# Patient Record
Sex: Male | Born: 1940 | Race: White | Hispanic: No | Marital: Married | Smoking: Never smoker
Health system: Southern US, Community
[De-identification: ages and names within clinical notes are randomized; demographics above are authoritative.]

## PROBLEM LIST (undated history)

## (undated) DIAGNOSIS — T7840XA Allergy, unspecified, initial encounter: Secondary | ICD-10-CM

## (undated) DIAGNOSIS — I10 Essential (primary) hypertension: Secondary | ICD-10-CM

## (undated) DIAGNOSIS — E785 Hyperlipidemia, unspecified: Secondary | ICD-10-CM

## (undated) DIAGNOSIS — E7800 Pure hypercholesterolemia, unspecified: Secondary | ICD-10-CM

## (undated) DIAGNOSIS — I6529 Occlusion and stenosis of unspecified carotid artery: Secondary | ICD-10-CM

## (undated) HISTORY — PX: MOUTH SURGERY: SHX715

## (undated) HISTORY — DX: Essential (primary) hypertension: I10

## (undated) HISTORY — PX: COLONOSCOPY: SHX174

## (undated) HISTORY — DX: Hyperlipidemia, unspecified: E78.5

## (undated) HISTORY — DX: Allergy, unspecified, initial encounter: T78.40XA

## (undated) HISTORY — DX: Occlusion and stenosis of unspecified carotid artery: I65.29

---

## 1965-07-23 HISTORY — PX: FINGER AMPUTATION: SHX636

## 1984-07-23 HISTORY — PX: INGUINAL HERNIA REPAIR: SUR1180

## 2002-07-08 ENCOUNTER — Ambulatory Visit (HOSPITAL_COMMUNITY): Admission: RE | Admit: 2002-07-08 | Discharge: 2002-07-08 | Payer: Self-pay | Admitting: *Deleted

## 2004-08-28 ENCOUNTER — Encounter: Admission: RE | Admit: 2004-08-28 | Discharge: 2004-08-28 | Payer: Self-pay | Admitting: Family Medicine

## 2006-01-04 ENCOUNTER — Encounter: Admission: RE | Admit: 2006-01-04 | Discharge: 2006-01-04 | Payer: Self-pay | Admitting: Family Medicine

## 2007-04-18 ENCOUNTER — Encounter: Admission: RE | Admit: 2007-04-18 | Discharge: 2007-04-18 | Payer: Self-pay | Admitting: Family Medicine

## 2007-05-09 ENCOUNTER — Ambulatory Visit: Payer: Self-pay | Admitting: Vascular Surgery

## 2007-11-07 ENCOUNTER — Ambulatory Visit: Payer: Self-pay | Admitting: Vascular Surgery

## 2008-10-29 ENCOUNTER — Ambulatory Visit: Payer: Self-pay | Admitting: Vascular Surgery

## 2009-10-28 ENCOUNTER — Ambulatory Visit: Payer: Self-pay | Admitting: Vascular Surgery

## 2010-08-13 ENCOUNTER — Encounter: Payer: Self-pay | Admitting: Family Medicine

## 2010-11-14 ENCOUNTER — Other Ambulatory Visit (INDEPENDENT_AMBULATORY_CARE_PROVIDER_SITE_OTHER): Payer: Medicare Other

## 2010-11-14 DIAGNOSIS — I6529 Occlusion and stenosis of unspecified carotid artery: Secondary | ICD-10-CM

## 2010-11-22 NOTE — Procedures (Unsigned)
CAROTID DUPLEX EXAM  INDICATION:  Follow up carotid disease.  HISTORY: Diabetes:  No. Cardiac:  No. Hypertension:  Yes. Smoking:  Tobacco use. Previous Surgery:  No. CV History:  No. Amaurosis Fugax No, Paresthesias No, Hemiparesis No.                                      RIGHT             LEFT Brachial systolic pressure:         132               138 Brachial Doppler waveforms:         WNL               WNL Vertebral direction of flow:        Antegrade         Antegrade DUPLEX VELOCITIES (cm/sec) CCA peak systolic                   109               152 ECA peak systolic                   279               131 ICA peak systolic                   101               76 ICA end diastolic                   29                16 PLAQUE MORPHOLOGY:                  Calcific          Calcific PLAQUE AMOUNT:                      Mild-to-moderate  Mild-to-moderate PLAQUE LOCATION:                    CCA/ECA/ICA       CCA/ICA  IMPRESSION: 1. 1% to 39% bilateral internal carotid artery stenosis.  This     represents a downgrade in severity bilaterally; however, velocities     are stable compared to prior reports.  Plaque is calcific with     acoustic shadowing present. 2. Bilateral vertebral arteries are within normal limits.  ___________________________________________ Larina Earthly, M.D.  LT/MEDQ  D:  11/14/2010  T:  11/14/2010  Job:  [id number]

## 2010-12-05 NOTE — Consult Note (Signed)
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  He has a known history of prior  asymptomatic carotid disease and has had several serial followup studies  regarding this.  He specifically denies any prior amaurosis fugax,  transient ischemic attack, or stroke.   PAST MEDICAL HISTORY:  Significant for elevated blood pressure and  elevated cholesterol.   FAMILY HISTORY:  Negative for premature atherosclerotic disease.   SOCIAL HISTORY:  He is married with 2 children.  He is retired.  He does  not smoke cigarettes, does chew tobacco, and does not drink alcohol on a  regular basis.   REVIEW OF SYSTEMS:  Specifically negative for cardiac, pulmonary, GI, or  GU symptoms.  He does report pain in his legs with walking, has no  neurologic deficits.   MEDICATIONS:  1. Simvastatin.  2. Trandolapril.  3. Daily aspirin.   PHYSICAL EXAM:  Well-developed, well-nourished, white male appearing  stated age of 65.  Blood pressure is 144/78.  Pulse 68.  Respirations  18.  He is grossly intact neurologically.  His carotid arteries do  reveal soft bruits bilaterally.  He has 2+ radial pulses and 2+ femoral  pulses.   I have his most recent carotid duplex from [facility].  This  reveals approximately 70% stenosis in the right internal carotid artery  and no significant left carotid stenosis.  This has shown some  progression over the past several years.  I had a long discussion with  the patient and his wife present.  I have recommended that he undergo  serial duplex followup to rule out any progression of his stenosis.  I  explained that he was at the threshold for elective repair for  asymptomatic disease and if he had any progression we would recommend  endarterectomy.  Also, discussed  symptoms of carotid disease with him  and he knows to notify us immediately should this occur.  Otherwise, we  will see him in 6 months with repeat carotid duplex at that time.   Larina Earthly, M.D.  Electronically Signed   TFE/MEDQ  D:  05/09/2007  T:  05/13/2007  Job:  [id number]   cc:   Ernestina Penna, M.D.

## 2010-12-05 NOTE — Procedures (Signed)
CAROTID DUPLEX EXAM   INDICATION:  Followup evaluation of known carotid artery disease.   HISTORY:  Diabetes:  No.  Cardiac:  No.  Hypertension:  Yes.  Smoking:  Patient chews tobacco.  Previous Surgery:  No.  CV History:  Patient reports no cerebrovascular symptoms at this time.  Previous duplex on April 18, 2007, revealed a 50-69% right ICA  stenosis and a less than 50% left ICA stenosis.  Amaurosis Fugax No, Paresthesias No, Hemiparesis No                                       RIGHT             LEFT  Brachial systolic pressure:         148               152  Brachial Doppler waveforms:         Triphasic         Triphasic  Vertebral direction of flow:        Antegrade         Antegrade  DUPLEX VELOCITIES (cm/sec)  CCA peak systolic                   133               103  ECA peak systolic                   373               108  ICA peak systolic                   98                84  ICA end diastolic                   31                26  PLAQUE MORPHOLOGY:                  Calcified         Calcified  PLAQUE AMOUNT:                      Mild              Mild  PLAQUE LOCATION:                    Proximal ICA, ECA Proximal ICA   IMPRESSION:  1. ICA stenosis 20-39% bilaterally.  2. Right ECA stenosis.   ___________________________________________  Larina Earthly, M.D.   MC/MEDQ  D:  11/07/2007  T:  11/07/2007  Job:  [id number]

## 2010-12-05 NOTE — Assessment & Plan Note (Signed)
OFFICE VISIT   Albert Garner, Albert Garner  DOB:  05/28/1941                                       11/07/2007  JXBJY#:[id number]   The patient presents today for continued followup of his asymptomatic  carotid disease.  I had seen him 6 months ago, at which time he had had  an ultrasound from [facility] of his carotids revealing moderate  to severe right internal carotid artery stenosis.  He does remain  completely asymptomatic.  He has had no cardiac disease.  He has no  neurologic deficits.   PAST MEDICAL HISTORY:  Significant for hypertension, elevated blood  pressure.  He does not smoke.  He does chew tobacco and does not drink  alcohol.   MEDICATIONS:  Simvastatin, perindopril, and daily aspirin.   PHYSICAL EXAM:  Well-developed, well-nourished white male appearing  stated age of 42.  Blood pressure is 143/68, pulse 70, respirations 16.  He has 2+ radial pulses bilaterally.  He is grossly intact  neurologically.  He does have a harsh right carotid bruit and no bruit  on the left.   He underwent repeat carotid duplex today.  This reveals significant  difference in the study done at [facility].  It appears that he  does have external carotid stenosis, but we cannot identify any evidence  of internal carotid artery stenosis bilaterally.  I discussed this at  length with the patient and explained that there is no clinical  significance of external carotid stenosis.  I explained that we would  like to see him again 1 more time in 1 year to repeat this, to confirm  this study since it is so significantly different from a study at an  outlying vascular lab.  He will notify us should he develop any  neurologic deficits.  Otherwise, we will see him again in 1 year for  repeat carotid duplex.   Larina Earthly, M.D.  Electronically Signed   TFE/MEDQ  D:  11/07/2007  T:  11/10/2007  Job:  [id number]   cc:   Ernestina Penna, M.D.

## 2010-12-05 NOTE — Procedures (Signed)
CAROTID DUPLEX EXAM   INDICATION:  Followup known carotid disease.   HISTORY:  Diabetes:  No.  Cardiac:  No.  Hypertension:  Yes.  Smoking:  Chews.  Previous Surgery:  No.  CV History:  No.  Amaurosis Fugax No, Paresthesias No, Hemiparesis No                                       RIGHT             LEFT  Brachial systolic pressure:  Brachial Doppler waveforms:  Vertebral direction of flow:  DUPLEX VELOCITIES (cm/sec)  CCA peak systolic                   119               146  ECA peak systolic                   369               132  ICA peak systolic                   103               97  ICA end diastolic                   35                27  PLAQUE MORPHOLOGY:                  Heterogeneous     Heterogeneous  PLAQUE AMOUNT:                      Mild              Mild  PLAQUE LOCATION:                    Bifurcation, ICA, ECA               Bifurcation, ICA, ECA   IMPRESSION:  1. There is 40%-59% stenosis noted in the bilateral internal carotid      arteries.  2. Right external carotid artery stenosis.  3. Antegrade bilateral vertebral arteries.   ___________________________________________  Larina Earthly, M.D.   NT/MEDQ  D:  10/28/2009  T:  10/28/2009  Job:  [id number]

## 2010-12-05 NOTE — Procedures (Signed)
CAROTID DUPLEX EXAM   INDICATION:  Follow up known carotid artery disease.   HISTORY:  Diabetes:  No.  Cardiac:  No.  Hypertension:  Yes.  Smoking:  Chews tobacco.  Previous Surgery:  CV History:  Amaurosis Fugax No, Paresthesias No, Hemiparesis No.                                       RIGHT             LEFT  Brachial systolic pressure:         142               130  Brachial Doppler waveforms:         Biphasic          Biphasic  Vertebral direction of flow:        Antegrade         Antegrade  DUPLEX VELOCITIES (cm/sec)  CCA peak systolic                   100               108  ECA peak systolic                   407               177  ICA peak systolic                   124               120  ICA end diastolic                   38                31  PLAQUE MORPHOLOGY:                  Heterogenous      Heterogenous  PLAQUE AMOUNT:                      Mild              Mild  PLAQUE LOCATION:                    ICA, ECA          ICA, ECA   IMPRESSION:  1. 40-59% stenosis noted in the bilateral internal carotid arteries.  2. Antegrade bilateral vertebral arteries.       ___________________________________________  Larina Earthly, M.D.   MG/MEDQ  D:  10/29/2008  T:  10/29/2008  Job:  [id number]

## 2012-02-06 ENCOUNTER — Other Ambulatory Visit: Payer: Self-pay

## 2012-02-06 DIAGNOSIS — I6529 Occlusion and stenosis of unspecified carotid artery: Secondary | ICD-10-CM

## 2012-02-07 ENCOUNTER — Telehealth: Payer: Self-pay | Admitting: Vascular Surgery

## 2012-02-07 NOTE — Telephone Encounter (Addendum)
Message copied by Shari Prows on Thu Feb 07, 2012 10:09 AM ------      Message from: Phillips Odor      Created: Wed Feb 06, 2012  3:55 PM      Regarding: needs appt and vascular lab       Rec'd call from Dr. Scharlene Gloss office / [facility].  Pt. Needs to have f/u carotid duplex and office visit.  Last Carotid U/S was 10/2010.  Please call pt with appts.  I will place order in computer.  Per above note I scheduled an appointment for a carotid u/s and fu w/ tfe on 03/18/12 at 11am carotid u/s and 12:15pm w/ tfe. Left message for pt MV:HQIO and alos mailed letter. Drinda Butts tobin

## 2012-03-17 ENCOUNTER — Encounter: Payer: Self-pay | Admitting: Vascular Surgery

## 2012-03-18 ENCOUNTER — Other Ambulatory Visit (INDEPENDENT_AMBULATORY_CARE_PROVIDER_SITE_OTHER): Payer: Medicare Other

## 2012-03-18 ENCOUNTER — Encounter: Payer: Self-pay | Admitting: Vascular Surgery

## 2012-03-18 ENCOUNTER — Ambulatory Visit (INDEPENDENT_AMBULATORY_CARE_PROVIDER_SITE_OTHER): Payer: Medicare Other | Admitting: Vascular Surgery

## 2012-03-18 VITALS — BP 142/74 | HR 63 | Resp 18 | Ht 73.0 in | Wt 169.1 lb

## 2012-03-18 DIAGNOSIS — I6529 Occlusion and stenosis of unspecified carotid artery: Secondary | ICD-10-CM

## 2012-03-18 NOTE — Progress Notes (Signed)
Vascular and Vein Specialist of [facility]   Patient name: Albert Garner MRN: [id number] DOB: Dec 23, 1940 Sex: male   Referred by: Margo Aye  Reason for referral:  Chief Complaint  Patient presents with  . Follow-up    carotid duplex 03-18-2012  . Carotid    HISTORY OF PRESENT ILLNESS: Patient is a 71 year old gentleman with known moderate right carotid artery stenosis. He undergoes yearly carotid duplex for further evaluation rule out progression of this. He specifically denies any prior amaurosis fugax, transient ischemic attack or stroke. He has no history of coronary disease. He is quite active with no major medical difficulties.  Past Medical History  Diagnosis Date  . Carotid artery occlusion   . Hypertension     Past Surgical History  Procedure Date  . Inguinal hernia repair 1986  . 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 Marital Status: Married    Spouse Name: N/A    Number of Children: N/A  . Years of Education: N/A   Occupational History  . Not on file.   Social History Main Topics  . Smoking status: Never Smoker   . Smokeless tobacco: Current User    Types: Chew  . Alcohol Use: No  . Drug Use: No  . Sexually Active: Not on file   Other Topics Concern  . Not on file   Social History Narrative  . No narrative on file    History reviewed. No pertinent family history.  Allergies as of 03/18/2012  . (No Known Allergies)    Current Outpatient Prescriptions on File Prior to Visit  Medication Sig Dispense Refill  . aspirin 81 MG tablet Take 81 mg by mouth daily.      . simvastatin (ZOCOR) 40 MG tablet Take 40 mg by mouth every evening.      . trandolapril (MAVIK) 1 MG tablet Take 1 mg by mouth daily.         REVIEW OF SYSTEMS:  Positives indicated with an "X"  CARDIOVASCULAR:  [ ]  chest pain   [ ]  chest pressure   [ ]  palpitations   [ ]  orthopnea   [ ]  dyspnea on exertion   [ ]  claudication   [ ]  rest pain    [ ]  DVT   [ ]  phlebitis PULMONARY:   [ ]  productive cough   [ ]  asthma   [ ]  wheezing NEUROLOGIC:   [ ]  weakness  [ ]  paresthesias  [ ]  aphasia  [ ]  amaurosis  [ ]  dizziness HEMATOLOGIC:   [ ]  bleeding problems   [ ]  clotting disorders MUSCULOSKELETAL:  [ ]  joint pain   [ ]  joint swelling GASTROINTESTINAL: [ ]   blood in stool  [ ]   hematemesis GENITOURINARY:  [ ]   dysuria  [ ]   hematuria PSYCHIATRIC:  [ ]  history of major depression INTEGUMENTARY:  [ ]  rashes  [ ]  ulcers CONSTITUTIONAL:  [ ]  fever   [ ]  chills  PHYSICAL EXAMINATION:  General: The patient is a well-nourished male, in no acute distress. Vital signs are BP 142/74  Pulse 63  Resp 18  Ht 6\' 1"  (1.854 m)  Wt 169 lb 1.6 oz (76.703 kg)  BMI 22.31 kg/m2 Pulmonary: There is a good air exchange bilaterally without wheezing or rales. Abdomen: Soft and non-tender with normal pitch bowel sounds. Musculoskeletal: There are no major deformities.  There is no significant extremity pain. Neurologic: No focal  weakness or paresthesias are detected, Skin: There are no ulcer or rashes noted. Psychiatric: The patient has normal affect. Cardiovascular: There is a regular rate and rhythm without significant murmur appreciated. Carotid: Right carotid bruit, no bruising the left Pulse status 2+ radial 2+ femoral 2+ popliteal pulses bilaterally  [facility] Vascular Lab Studies:  Ordered and Independently Reviewed moderate 40-59% right internal carotid artery stenosis and no significant left carotid stenosis. He does have a high-grade right external carotid stenosis  Impression and Plan:  Asymptomatic moderate right carotid stenosis. The patient is right-handed. I reviewed symptoms with her carotid disease with patient to notify us immediately should this occur. Otherwise he'll be seen on a yearly basis to rule out asymptomatic progression.    Ninoska Goswick [facility] [place] Office: [phone]

## 2012-03-18 NOTE — Addendum Note (Signed)
Addended by: Sharee Pimple on: 03/18/2012 01:58 PM   Modules accepted: Orders

## 2013-03-19 ENCOUNTER — Ambulatory Visit: Payer: Self-pay | Admitting: Family

## 2013-03-19 ENCOUNTER — Other Ambulatory Visit: Payer: Medicare Other

## 2013-04-03 ENCOUNTER — Encounter: Payer: Self-pay | Admitting: Family

## 2013-04-06 ENCOUNTER — Ambulatory Visit (INDEPENDENT_AMBULATORY_CARE_PROVIDER_SITE_OTHER): Payer: Medicare Other | Admitting: Family

## 2013-04-06 ENCOUNTER — Encounter: Payer: Self-pay | Admitting: Family

## 2013-04-06 ENCOUNTER — Other Ambulatory Visit: Payer: Self-pay

## 2013-04-06 ENCOUNTER — Other Ambulatory Visit (INDEPENDENT_AMBULATORY_CARE_PROVIDER_SITE_OTHER): Payer: Medicare Other | Admitting: *Deleted

## 2013-04-06 ENCOUNTER — Ambulatory Visit: Payer: Self-pay | Admitting: Family

## 2013-04-06 DIAGNOSIS — I6529 Occlusion and stenosis of unspecified carotid artery: Secondary | ICD-10-CM

## 2013-04-06 NOTE — Progress Notes (Signed)
Established Carotid Patient  History of Present Illness  Albert Garner is a 72 y.o. male patient who has been followed by Dr. Arbie Cookey for known carotid stenosis.  Patient has not had previous  CEA or stent placement.  Patient has Negative history of TIA or stroke symptom.  The patient denies amaurosis fugax or monocular blindness.  The patient  denies facial drooping.  Pt. denies hemiplegia.  The patient denies receptive or expressive aphasia.  Pt. denies extremity weakness.  The patient's previous neurologic deficits are Unchanged. He walks a great deal.   denies New Medical or Surgical History  Pt Diabetic: No Pt smoker: non-smoker  Pt meds include: Statin : Yes Betablocker: No ASA: Yes Other anticoagulants/antiplatelets: none   Past Medical History  Diagnosis Date  . Carotid artery occlusion   . Hypertension     Social History History  Substance Use Topics  . Smoking status: Never Smoker   . Smokeless tobacco: Current User    Types: Chew  . Alcohol Use: No    Family History Family History  Problem Relation Age of Onset  . Family history unknown: Yes    Surgical History Past Surgical History  Procedure Laterality Date  . Inguinal hernia repair  1986  . Finger amputation  1967    left hand 3 rd digit    No Known Allergies  Current Outpatient Prescriptions  Medication Sig Dispense Refill  . aspirin 81 MG tablet Take 81 mg by mouth daily.      . simvastatin (ZOCOR) 40 MG tablet Take 40 mg by mouth every evening.      . trandolapril (MAVIK) 1 MG tablet Take 1 mg by mouth daily.       No current facility-administered medications for this visit.    Review of Systems : [x]  Positive   [ ]  Denies  General:[ ]  Weight loss,  [ ]  Weight gain, [ ]  Loss of appetite, [ ]  Fever, [ ]  chills  Neurologic: [ ]  Dizziness, [ ]  Blackouts, [ ]  Headaches, [ ]  Seizure [ ]  Stroke, [ ]  "Mini stroke", [ ]  Slurred speech, [ ]  Temporary blindness;  [  ]weakness,  Ear/Nose/Throat: [ ]  Change in hearing, [ ]  Nose bleeds, [ ]  Hoarseness  Vascular:[ ]  Pain in legs with walking, [ ]  Pain in feet while lying flat , [ ]   Non-healing ulcer, [ ]  Blood clot in vein,    Pulmonary: [ ]  Home oxygen, [ ]   Productive cough, [ ]  Bronchitis, [ ]  Coughing up blood,  [ ]  Asthma, [ ]  Wheezing  Musculoskeletal:  [ ]  Arthritis, [ ]  Joint pain, [ ]  low back pain  Cardiac: [ ]  Chest pain, [ ]  Shortness of breath when lying flat, [ ]  Shortness of breath with exertion, [ ]  Palpitations, [ ]  Heart murmur, [ ]   Atrial fibrillation  Hematologic:[ ]  Easy Bruising, [ ]  Anemia; [ ]  Hepatitis  Psychiatric: [ ]   Depression, [ ]  Anxiety   Gastrointestinal: [ ]  Black stool, [ ]  Blood in stool, [ ]  Peptic ulcer disease,  [ ]  Gastroesophageal Reflux, [ ]  Trouble swallowing, [ ]  Diarrhea, [ ]  Constipation  Urinary: [ ]  chronic Kidney disease, [ ]  on HD, [ ]  Burning with urination, [ ]  Frequent urination, [ ]  Difficulty urinating;   Skin: [ ]  Rashes, [ ]  Wounds    Physical Examination  Filed Vitals:   04/06/13 1511  BP: 134/72  Pulse: 61  Resp:    Filed Weights  04/06/13 1507  Weight: 162 lb (73.483 kg)   Body mass index is 21.38 kg/(m^2).  General: WDWN male in NAD GAIT: normal Eyes: PERRLA Pulmonary:  CTAB, Negative  Rales, Negative rhonchi, & Negative wheezing.  Cardiac: regular Rhythm ,  Negative Murmurs.  VASCULAR EXAM Carotid Bruits Left Right   Negative Positive                                                                                                                                LE Pulses LEFT RIGHT       FEMORAL   palpable   palpable        POPLITEAL  not palpable   not palpable       POSTERIOR TIBIAL  palpable    palpable        DORSALIS PEDIS      ANTERIOR TIBIAL  palpable   palpable       Gastrointestinal: soft, nontender, BS WNL, no r/g,  negative masses.  Musculoskeletal: Negative muscle atrophy/wasting. M/S 5/5  throughout, Extremities without ischemic changes.  Neurologic: A&O X 3; Appropriate Affect ; SENSATION ;normal;  Speech is normal CN 2-12 intact, Pain and light touch intact in extremities, Motor exam as listed above.   Non-Invasive Vascular Imaging CAROTID DUPLEX 04/06/2013   Right ICA less than 40% stenosis Left ICA less than 40% stenosis  These findings are Improved from previous exam  Assessment: Albert Garner is a 72 y.o. male who presents with:Asymptomatic<40% Bilateral ICA  stenosis The  ICA stenosis is  Improved from previous exam. The right carotid bruit is secondary to right external carotid stenosis, which has no impact on stroke risk.  Plan: Follow-up in 1 year with Carotid Duplex scan.   I discussed in depth with the patient the nature of atherosclerosis, and emphasized the importance of maximal medical management including strict control of blood pressure, blood glucose, and lipid levels, obtaining regular exercise, and continued cessation of smoking.  The patient is aware that without maximal medical management the underlying atherosclerotic disease process will progress, limiting the benefit of any interventions. The patient was given information about stroke prevention and what symptoms should prompt the patient to seek immediate medical care. Thank you for allowing [place] to participate in this patient's care.  Charisse March, RN, MSN, FNP-C [facility] [place] Office: [phone]  Clinic Physician: Myra Gianotti  04/06/2013 2:19 PM

## 2013-04-06 NOTE — Patient Instructions (Addendum)
Stroke Prevention Some medical conditions and behaviors are associated with an increased chance of having a stroke. You may prevent a stroke by making healthy choices and managing medical conditions. Reduce your risk of having a stroke by:  Staying physically active. Get at least 30 minutes of activity on most or all days.  Not smoking. It may also be helpful to avoid exposure to secondhand smoke.  Limiting alcohol use. Moderate alcohol use is considered to be:  No more than 2 drinks per day for men.  No more than 1 drink per day for nonpregnant women.  Eating healthy foods.  Include 5 or more servings of fruits and vegetables a day.  Certain diets may be prescribed to address high blood pressure, high cholesterol, diabetes, or obesity.  Managing your cholesterol levels.  A low-saturated fat, low-trans fat, low-cholesterol, and high-fiber diet may control cholesterol levels.  Take any prescribed medicines to control cholesterol as directed by your caregiver.  Managing your diabetes.  A controlled-carbohydrate, controlled-sugar diet is recommended to manage diabetes.  Take any prescribed medicines to control diabetes as directed by your caregiver.  Controlling your high blood pressure (hypertension).  A low-salt (sodium), low-saturated fat, low-trans fat, and low-cholesterol diet is recommended to manage high blood pressure.  Take any prescribed medicines to control hypertension as directed by your caregiver.  Maintaining a healthy weight.  A reduced-calorie, low-sodium, low-saturated fat, low-trans fat, low-cholesterol diet is recommended to manage weight.  Stopping drug abuse.  Avoiding birth control pills.  Talk to your caregiver about the risks of taking birth control pills if you are over 35 years old, smoke, get migraines, or have ever had a blood clot.  Getting evaluated for sleep disorders (sleep apnea).  Talk to your caregiver about getting a sleep evaluation  if you snore a lot or have excessive sleepiness.  Taking medicines as directed by your caregiver.  For some people, aspirin or blood thinners (anticoagulants) are helpful in reducing the risk of forming abnormal blood clots that can lead to stroke. If you have the irregular heart rhythm of atrial fibrillation, you should be on a blood thinner unless there is a good reason you cannot take them.  Understand all your medicine instructions. SEEK IMMEDIATE MEDICAL CARE IF:   You have sudden weakness or numbness of the face, arm, or leg, especially on one side of the body.  You have sudden confusion.  You have trouble speaking (aphasia) or understanding.  You have sudden trouble seeing in one or both eyes.  You have sudden trouble walking.  You have dizziness.  You have a loss of balance or coordination.  You have a sudden, severe headache with no known cause.  You have new chest pain or an irregular heartbeat. Any of these symptoms may represent a serious problem that is an emergency. Do not wait to see if the symptoms will go away. Get medical help right away. Call your local emergency services (911 in [place] Do not drive yourself to the hospital. Document Released: 08/16/2004 Document Revised: 10/01/2011 Document Reviewed: 02/26/2011 [employer] Patient Information 2014 [employer].  

## 2013-09-17 ENCOUNTER — Other Ambulatory Visit: Payer: Self-pay | Admitting: Family

## 2013-09-17 DIAGNOSIS — I6529 Occlusion and stenosis of unspecified carotid artery: Secondary | ICD-10-CM

## 2014-04-06 ENCOUNTER — Other Ambulatory Visit (HOSPITAL_COMMUNITY): Payer: Medicare Other

## 2014-04-06 ENCOUNTER — Ambulatory Visit: Payer: Medicare Other | Admitting: Family

## 2014-04-12 ENCOUNTER — Encounter: Payer: Self-pay | Admitting: Family

## 2014-04-13 ENCOUNTER — Ambulatory Visit (HOSPITAL_COMMUNITY)
Admission: RE | Admit: 2014-04-13 | Discharge: 2014-04-13 | Disposition: A | Discharge status: Home / Self Care | Payer: Medicare Other | Source: Ambulatory Visit | Attending: Family | Admitting: Family

## 2014-04-13 ENCOUNTER — Encounter (INDEPENDENT_AMBULATORY_CARE_PROVIDER_SITE_OTHER): Payer: Medicare Other | Admitting: Family

## 2014-04-13 DIAGNOSIS — I6529 Occlusion and stenosis of unspecified carotid artery: Secondary | ICD-10-CM | POA: Diagnosis present

## 2014-04-14 ENCOUNTER — Encounter: Payer: Self-pay | Admitting: Vascular Surgery

## 2014-04-14 NOTE — Patient Instructions (Signed)
Dear Mr. Passero,  Your recent Lab study on Sept. 22, 2015 indicates: NO significant change noted when compared to the previous exam on Sept. 15, 2014. Please follow up with [place] in ONE year.          Stroke Prevention Some medical conditions and behaviors are associated with an increased chance of having a stroke. You may prevent a stroke by making healthy choices and managing medical conditions. HOW CAN I REDUCE MY RISK OF HAVING A STROKE?   Stay physically active. Get at least 30 minutes of activity on most or all days.  Do not smoke. It may also be helpful to avoid exposure to secondhand smoke.  Limit alcohol use. Moderate alcohol use is considered to be:  No more than 2 drinks per day for men.  No more than 1 drink per day for nonpregnant women.  Eat healthy foods. This involves:  Eating 5 or more servings of fruits and vegetables a day.  Making dietary changes that address high blood pressure (hypertension), high cholesterol, diabetes, or obesity.  Manage your cholesterol levels.  Making food choices that are high in fiber and low in saturated fat, trans fat, and cholesterol may control cholesterol levels.  Take any prescribed medicines to control cholesterol as directed by your health care provider.  Manage your diabetes.  Controlling your carbohydrate and sugar intake is recommended to manage diabetes.  Take any prescribed medicines to control diabetes as directed by your health care provider.  Control your hypertension.  Making food choices that are low in salt (sodium), saturated fat, trans fat, and cholesterol is recommended to manage hypertension.  Take any prescribed medicines to control hypertension as directed by your health care provider.  Maintain a healthy weight.  Reducing calorie intake and making food choices that are low in sodium, saturated fat, trans fat, and cholesterol are recommended to manage weight.  Stop drug abuse.  Avoid taking  birth control pills.  Talk to your health care provider about the risks of taking birth control pills if you are over 57 years old, smoke, get migraines, or have ever had a blood clot.  Get evaluated for sleep disorders (sleep apnea).  Talk to your health care provider about getting a sleep evaluation if you snore a lot or have excessive sleepiness.  Take medicines only as directed by your health care provider.  For some people, aspirin or blood thinners (anticoagulants) are helpful in reducing the risk of forming abnormal blood clots that can lead to stroke. If you have the irregular heart rhythm of atrial fibrillation, you should be on a blood thinner unless there is a good reason you cannot take them.  Understand all your medicine instructions.  Make sure that other conditions (such as anemia or atherosclerosis) are addressed. SEEK IMMEDIATE MEDICAL CARE IF:   You have sudden weakness or numbness of the face, arm, or leg, especially on one side of the body.  Your face or eyelid droops to one side.  You have sudden confusion.  You have trouble speaking (aphasia) or understanding.  You have sudden trouble seeing in one or both eyes.  You have sudden trouble walking.  You have dizziness.  You have a loss of balance or coordination.  You have a sudden, severe headache with no known cause.  You have new chest pain or an irregular heartbeat. Any of these symptoms may represent a serious problem that is an emergency. Do not wait to see if the symptoms will go away.  Get medical help at once. Call your local emergency services (911 in [place] Do not drive yourself to the hospital. Document Released: 08/16/2004 Document Revised: 11/23/2013 Document Reviewed: 01/09/2013 [facility] Patient Information 2015 [place], [place]. This information is not intended to replace advice given to you by your health care provider. Make sure you discuss any questions you have with your health care  provider.

## 2014-04-15 NOTE — Progress Notes (Signed)
Lab only 

## 2014-11-24 ENCOUNTER — Encounter: Payer: Self-pay | Admitting: Family Medicine

## 2014-12-13 ENCOUNTER — Encounter: Payer: Self-pay | Admitting: Internal Medicine

## 2014-12-13 ENCOUNTER — Encounter: Payer: Self-pay | Admitting: Family Medicine

## 2014-12-13 ENCOUNTER — Ambulatory Visit (INDEPENDENT_AMBULATORY_CARE_PROVIDER_SITE_OTHER): Payer: Medicare Other | Admitting: Family Medicine

## 2014-12-13 VITALS — BP 138/72 | HR 60 | Temp 97.5°F | Resp 18 | Ht <= 58 in | Wt 156.0 lb

## 2014-12-13 DIAGNOSIS — I2510 Atherosclerotic heart disease of native coronary artery without angina pectoris: Secondary | ICD-10-CM | POA: Diagnosis not present

## 2014-12-13 DIAGNOSIS — D485 Neoplasm of uncertain behavior of skin: Secondary | ICD-10-CM

## 2014-12-13 DIAGNOSIS — Z125 Encounter for screening for malignant neoplasm of prostate: Secondary | ICD-10-CM

## 2014-12-13 DIAGNOSIS — Z0000 Encounter for general adult medical examination without abnormal findings: Secondary | ICD-10-CM | POA: Diagnosis not present

## 2014-12-13 LAB — COMPLETE METABOLIC PANEL WITH GFR
ALT: 13 U/L (ref 0–53)
AST: 17 U/L (ref 0–37)
Albumin: 4.1 g/dL (ref 3.5–5.2)
Alkaline Phosphatase: 58 U/L (ref 39–117)
BILIRUBIN TOTAL: 0.7 mg/dL (ref 0.2–1.2)
BUN: 14 mg/dL (ref 6–23)
CO2: 30 meq/L (ref 19–32)
CREATININE: 0.87 mg/dL (ref 0.50–1.35)
Calcium: 8.9 mg/dL (ref 8.4–10.5)
Chloride: 105 mEq/L (ref 96–112)
GFR, Est African American: >89 mL/min
GFR, Est Non African American: 86 mL/min
Glucose, Bld: 93 mg/dL (ref 70–99)
POTASSIUM: 4.3 meq/L (ref 3.5–5.3)
SODIUM: 142 meq/L (ref 135–145)
TOTAL PROTEIN: 6.3 g/dL (ref 6.0–8.3)

## 2014-12-13 LAB — CBC WITH DIFFERENTIAL/PLATELET
Basophils Absolute: 0 10*3/uL (ref 0.0–0.1)
Basophils Relative: 0 % (ref 0–1)
Eosinophils Absolute: 0.1 10*3/uL (ref 0.0–0.7)
Eosinophils Relative: 3 % (ref 0–5)
HEMATOCRIT: 40 % (ref 39.0–52.0)
HEMOGLOBIN: 13.5 g/dL (ref 13.0–17.0)
LYMPHS ABS: 1.1 10*3/uL (ref 0.7–4.0)
LYMPHS PCT: 28 % (ref 12–46)
MCH: 30.7 pg (ref 26.0–34.0)
MCHC: 33.8 g/dL (ref 30.0–36.0)
MCV: 90.9 fL (ref 78.0–100.0)
MPV: 10 fL (ref 8.6–12.4)
Monocytes Absolute: 0.4 10*3/uL (ref 0.1–1.0)
Monocytes Relative: 9 % (ref 3–12)
NEUTROS PCT: 60 % (ref 43–77)
Neutro Abs: 2.5 10*3/uL (ref 1.7–7.7)
PLATELETS: 156 10*3/uL (ref 150–400)
RBC: 4.4 MIL/uL (ref 4.22–5.81)
RDW: 13.3 % (ref 11.5–15.5)
WBC: 4.1 10*3/uL (ref 4.0–10.5)

## 2014-12-13 LAB — LIPID PANEL
CHOL/HDL RATIO: 2.6 ratio
CHOLESTEROL: 132 mg/dL (ref 0–200)
HDL: 50 mg/dL (ref >=40)
LDL CALC: 68 mg/dL (ref 0–99)
TRIGLYCERIDES: 70 mg/dL (ref <150)
VLDL: 14 mg/dL (ref 0–40)

## 2014-12-13 NOTE — Progress Notes (Signed)
Subjective:    Patient ID: Albert Garner, male    DOB: 01/19/1941, 74 y.o.   MRN: [id number]  HPI Patient is a very pleasant 74 year old white male here today to establish care. Past medical history is significant for bilateral occlusion of the carotid arteries. Each is less than 40% and is being managed medically with statin therapy and an antiplatelet drug. He also has a history of hypertension and hyperlipidemia. He is overdue for colonoscopy. He is overdue for a prostate exam. He would like me to schedule the colonoscopy. We will perform a prostate exam today. The patient is also due for the pneumonia vaccine, a tetanus shot, and the shingles vaccine. We discussed all these vaccines at length. The patient elects to defer each of the shots at the present time. There are 2 lesions on his left temple just anterior to his ear. Each is approximately 6 mm in size. Each is erythematous with white scale. The patient states that his wife says each of these lesions are growing recently. 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 He elects to treat each of these lesions today with liquid nitrogen cryotherapy Past Medical History  Diagnosis Date  . Carotid artery occlusion   . Hypertension   . Hyperlipidemia    Past Surgical History  Procedure Laterality Date  . Inguinal hernia repair  1986  . 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 aspirin 81 MG tablet Take 81 mg by mouth daily.    . trandolapril (MAVIK) 1 MG tablet Take 1 mg by mouth daily.    . traZODone (DESYREL) 50 MG tablet Take 50 mg by mouth at bedtime.     No current facility-administered medications on file prior to visit.   No Known Allergies History   Social History  . Marital Status: Married    Spouse Name: N/A  . Number of Children: N/A  . Years of Education: N/A   Occupational History  .  Not on file.   Social History Main Topics  . Smoking status: Never Smoker   . Smokeless tobacco: Current User    Types: Chew  . Alcohol Use: No  . Drug Use: No  . Sexual Activity: Yes   Other Topics Concern  . Not on file   Social History Narrative   Family History  Problem Relation Age of Onset  . Heart disease Mother       Review of Systems  All other systems reviewed and are negative.      Objective:   Physical Exam  Constitutional: He is oriented to person, place, and time. He appears well-developed and well-nourished. No distress.  HENT:  Head: Normocephalic and atraumatic.  Right Ear: External ear normal.  Left Ear: External ear normal.  Nose: Nose normal.  Mouth/Throat: Oropharynx is clear and moist. No oropharyngeal exudate.  Eyes: Conjunctivae and EOM are normal. Pupils are equal, round, and reactive to light. Right eye exhibits no discharge. Left eye exhibits no discharge. No scleral icterus.  Neck: Normal range of motion. Neck supple. No JVD present. No tracheal deviation present. No thyromegaly present.  Cardiovascular: Normal rate, regular rhythm, normal heart sounds and intact distal pulses.  Exam reveals no gallop and no friction rub.   No murmur heard. Pulmonary/Chest: Effort normal and breath sounds normal. No stridor. No respiratory distress. He has no  wheezes. He has no rales. He exhibits no tenderness.  Abdominal: Soft. Bowel sounds are normal. He exhibits no distension and no mass. There is no tenderness. There is no rebound and no guarding.  Genitourinary: Rectum normal, prostate normal and penis normal.  Musculoskeletal: Normal range of motion. He exhibits no edema or tenderness.  Lymphadenopathy:    He has no cervical adenopathy.  Neurological: He is alert and oriented to person, place, and time. He has normal reflexes. He displays normal reflexes. No cranial nerve deficit. He exhibits normal muscle tone. Coordination normal.  Skin: Skin is warm.  No rash noted. He is not diaphoretic. There is erythema. No pallor.  Psychiatric: He has a normal mood and affect. His behavior is normal. Judgment and thought content normal.  Vitals reviewed.  please see the description in the history of present illness of the 2 lesions on his left temple just anterior to his ear.        Assessment & Plan:  ASCVD (arteriosclerotic cardiovascular disease) - Plan: CBC with Differential/Platelet, COMPLETE METABOLIC PANEL WITH GFR, Lipid panel  Prostate cancer screening - Plan: PSA, Medicare  Routine general medical examination at a health care facility  Neoplasm of uncertain behavior of skin  Both lesions on the left temple were treated with liquid nitrogen cryotherapy for a total of 30 seconds each. If the lesions persist, I would recommend a shave biopsy. The remainder of his physical exam is normal. I will schedule the patient for colonoscopy. His prostate exam today is normal. I will check a CBC, CMP, fasting lipid panel, and a PSA. Patient's goal LDL cholesterol is less than 70. I would like to change the patient off his simvastatin 80 mg a day due to the risk of myalgias and myositis. Depending upon the results of his fasting lipid panel I will either switch the patient to Lipitor 40 mg a day or decrease simvastatin to 40 mg a day. I recommended the pneumonia vaccine but the patient declines.

## 2014-12-14 ENCOUNTER — Other Ambulatory Visit: Payer: Self-pay | Admitting: Family Medicine

## 2014-12-14 LAB — PSA, MEDICARE: PSA: 0.64 ng/mL (ref <=4.00)

## 2014-12-14 MED ORDER — ATORVASTATIN CALCIUM 40 MG PO TABS: 40.0000 mg | ORAL_TABLET | Freq: Every day | ORAL | Status: DC

## 2014-12-17 ENCOUNTER — Telehealth: Payer: Self-pay | Admitting: Family Medicine

## 2014-12-17 MED ORDER — TRANDOLAPRIL 1 MG PO TABS: 1.0000 mg | ORAL_TABLET | Freq: Every day | ORAL | Status: DC

## 2014-12-17 NOTE — Telephone Encounter (Signed)
PT is needing a refill on trandolapril (MAVIK) 1 MG tablet (he has enough to last him to Sunday) [phone] [facility] [place], [place] - [address]

## 2014-12-17 NOTE — Telephone Encounter (Signed)
Medication called/sent to requested pharmacy  

## 2014-12-21 ENCOUNTER — Telehealth: Payer: Self-pay | Admitting: *Deleted

## 2014-12-21 MED ORDER — TRAZODONE HCL 50 MG PO TABS: 50.0000 mg | ORAL_TABLET | Freq: Every day | ORAL | Status: DC

## 2014-12-21 NOTE — Telephone Encounter (Signed)
ok 

## 2014-12-21 NOTE — Telephone Encounter (Signed)
?   OK to Refill  

## 2014-12-21 NOTE — Telephone Encounter (Signed)
The message I received Friday was for his BP med :  Lenore Manner at 12/17/2014 3:22 PM     Status: Signed       Expand All Collapse All   PT is needing a refill on trandolapril (MAVIK) 1 MG tablet (he has enough to last him to Sunday) [phone] [facility] [place], [place] - [address]       Trazadone sent to pharm

## 2014-12-21 NOTE — Telephone Encounter (Signed)
Refill on Trazadone 50mg , states called on Friday to have it sent to pharmacy and never received it.  [facility] [place], [place] - [address]

## 2015-01-12 ENCOUNTER — Ambulatory Visit (AMBULATORY_SURGERY_CENTER): Payer: Self-pay | Admitting: *Deleted

## 2015-01-12 VITALS — Ht 73.0 in | Wt 154.6 lb

## 2015-01-12 DIAGNOSIS — Z1211 Encounter for screening for malignant neoplasm of colon: Secondary | ICD-10-CM

## 2015-01-12 NOTE — Progress Notes (Signed)
No issues with past sedation No egg or soy allergy No diet pills no home 02

## 2015-01-21 HISTORY — PX: COLONOSCOPY: SHX174

## 2015-01-22 ENCOUNTER — Encounter: Payer: Self-pay | Admitting: Gastroenterology

## 2015-01-26 ENCOUNTER — Encounter: Payer: Self-pay | Admitting: Gastroenterology

## 2015-01-26 ENCOUNTER — Ambulatory Visit (AMBULATORY_SURGERY_CENTER): Payer: Medicare Other | Admitting: Gastroenterology

## 2015-01-26 VITALS — BP 127/55 | HR 60 | Temp 96.5°F | Resp 16 | Ht 73.0 in | Wt 154.0 lb

## 2015-01-26 DIAGNOSIS — D120 Benign neoplasm of cecum: Secondary | ICD-10-CM | POA: Diagnosis not present

## 2015-01-26 DIAGNOSIS — D122 Benign neoplasm of ascending colon: Secondary | ICD-10-CM

## 2015-01-26 DIAGNOSIS — Z1211 Encounter for screening for malignant neoplasm of colon: Secondary | ICD-10-CM | POA: Diagnosis not present

## 2015-01-26 DIAGNOSIS — D124 Benign neoplasm of descending colon: Secondary | ICD-10-CM

## 2015-01-26 DIAGNOSIS — K635 Polyp of colon: Secondary | ICD-10-CM

## 2015-01-26 MED ORDER — SODIUM CHLORIDE 0.9 % IV SOLN: 500.0000 mL | INTRAVENOUS | Status: DC

## 2015-01-26 NOTE — Patient Instructions (Signed)
YOU HAD AN ENDOSCOPIC PROCEDURE TODAY AT THE [facility]:   Refer to the procedure report that was given to you for any specific questions about what was found during the examination.  If the procedure report does not answer your questions, please call your gastroenterologist to clarify.  If you requested that your care partner not be given the details of your procedure findings, then the procedure report has been included in a sealed envelope for you to review at your convenience later.  YOU SHOULD EXPECT: Some feelings of bloating in the abdomen. Passage of more gas than usual.  Walking can help get rid of the air that was put into your GI tract during the procedure and reduce the bloating. If you had a lower endoscopy (such as a colonoscopy or flexible sigmoidoscopy) you may notice spotting of blood in your stool or on the toilet paper. If you underwent a bowel prep for your procedure, you may not have a normal bowel movement for a few days.  Please Note:  You might notice some irritation and congestion in your nose or some drainage.  This is from the oxygen used during your procedure.  There is no need for concern and it should clear up in a day or so.  SYMPTOMS TO REPORT IMMEDIATELY:   Following lower endoscopy (colonoscopy or flexible sigmoidoscopy):  Excessive amounts of blood in the stool  Significant tenderness or worsening of abdominal pains  Swelling of the abdomen that is new, acute  Fever of 100F or higher   For urgent or emergent issues, a gastroenterologist can be reached at any hour by calling [phone].   DIET: Your first meal following the procedure should be a small meal and then it is ok to progress to your normal diet. Heavy or fried foods are harder to digest and may make you feel nauseous or bloated.  Likewise, meals heavy in dairy and vegetables can increase bloating.  Drink plenty of fluids but you should avoid alcoholic beverages for 24  hours.  ACTIVITY:  You should plan to take it easy for the rest of today and you should NOT DRIVE or use heavy machinery until tomorrow (because of the sedation medicines used during the test).    FOLLOW UP: Our staff will call the number listed on your records the next business day following your procedure to check on you and address any questions or concerns that you may have regarding the information given to you following your procedure. If we do not reach you, we will leave a message.  However, if you are feeling well and you are not experiencing any problems, there is no need to return our call.  We will assume that you have returned to your regular daily activities without incident.  If any biopsies were taken you will be contacted by phone or by letter within the next 1-3 weeks.  Please call us at [phone] if you have not heard about the biopsies in 3 weeks.    SIGNATURES/CONFIDENTIALITY: You and/or your care partner have signed paperwork which will be entered into your electronic medical record.  These signatures attest to the fact that that the information above on your After Visit Summary has been reviewed and is understood.  Full responsibility of the confidentiality of this discharge information lies with you and/or your care-partner.     Handout was given to your care partner on polyps. You may resume your current medications today. Await biopsy results. Please call   if any questions or concerns.   

## 2015-01-26 NOTE — Progress Notes (Signed)
Patient awakening,vss,report to rn 

## 2015-01-26 NOTE — Progress Notes (Signed)
Called to room to assist during endoscopic procedure.  Patient ID and intended procedure confirmed with present staff. Received instructions for my participation in the procedure from the performing physician.  

## 2015-01-26 NOTE — Progress Notes (Signed)
No problems noted in the recovery room. maw 

## 2015-01-26 NOTE — Op Note (Signed)
[place]  [address] [place], [id number]   COLONOSCOPY PROCEDURE REPORT  PATIENT: Albert Garner, Albert Garner  MR#: [id number] BIRTHDATE: 29-Oct-1940 , 74  yrs. old GENDER: male ENDOSCOPIST: Milus Banister, MD REFERRED [id number] Dennard Schaumann, M.D. PROCEDURE DATE:  01/26/2015 PROCEDURE:   Colonoscopy with snare polypectomy and Colonoscopy, screening First Screening Colonoscopy - Avg.  risk and is 50 yrs.  old or older - No.  Prior Negative Screening - Now for repeat screening. 10 or more years since last screening  History of Adenoma - Now for follow-up colonoscopy & has been > or = to 3 yrs.  N/A  Polyps removed today? Yes ASA CLASS:   Class II INDICATIONS:Screening for colonic neoplasia and Colorectal Neoplasm Risk Assessment for this procedure is average risk. MEDICATIONS: Monitored anesthesia care and Propofol 150 mg IV  DESCRIPTION OF PROCEDURE:   After the risks benefits and alternatives of the procedure were thoroughly explained, informed consent was obtained.  The digital rectal exam revealed no abnormalities of the rectum.   The [id number]  endoscope was introduced through the anus and advanced to the cecum, which was identified by both the appendix and ileocecal valve. No adverse events experienced.   The quality of the prep was excellent.  good. The instrument was then slowly withdrawn as the colon was fully examined. Estimated blood loss is zero unless otherwise noted in this procedure report.   COLON FINDINGS: Four sessile polyps ranging between 3-43mm in size were found in the sigmoid colon, ascending colon, and at the cecum. Polypectomies were performed with a cold snare.  The resection was complete, the polyp tissue was completely retrieved and sent to histology.   The examination was otherwise normal.  Retroflexed views revealed no abnormalities. The time to cecum = 1.3 Withdrawal time = 9.8   The scope was withdrawn and the procedure  completed. COMPLICATIONS: There were no immediate complications.  ENDOSCOPIC IMPRESSION: 1.   Four sessile polyps ranging between 3-95mm in size were found in the sigmoid colon, ascending colon, and at the cecum; polypectomies were performed with a cold snare 2.   The examination was otherwise normal  RECOMMENDATIONS: If the polyp(s) removed today are proven to be adenomatous (pre-cancerous) polyps, you will need a colonoscopy in 3-5 years. Otherwise you should continue to follow colorectal cancer screening guidelines for "routine risk" patients with a colonoscopy in 10 years.  You will receive a letter within 1-2 weeks with the results of your biopsy as well as final recommendations.  Please call my office if you have not received a letter after 3 weeks.  eSigned:  Milus Banister, MD 01/26/2015 11:19 AM

## 2015-01-27 ENCOUNTER — Telehealth: Payer: Self-pay | Admitting: *Deleted

## 2015-01-27 NOTE — Telephone Encounter (Signed)
  Follow up Call-  Call back number 01/26/2015  Post procedure Call Back phone  # [phone]  Permission to leave phone message Yes    [facility]

## 2015-01-31 ENCOUNTER — Encounter: Payer: Self-pay | Admitting: Gastroenterology

## 2015-04-14 ENCOUNTER — Ambulatory Visit: Payer: Self-pay | Admitting: Family

## 2015-04-14 ENCOUNTER — Encounter (HOSPITAL_COMMUNITY): Payer: Self-pay

## 2015-04-18 ENCOUNTER — Encounter: Payer: Self-pay | Admitting: Family

## 2015-04-20 ENCOUNTER — Ambulatory Visit (INDEPENDENT_AMBULATORY_CARE_PROVIDER_SITE_OTHER): Payer: Medicare Other | Admitting: Family

## 2015-04-20 ENCOUNTER — Encounter: Payer: Self-pay | Admitting: Family

## 2015-04-20 ENCOUNTER — Other Ambulatory Visit: Payer: Self-pay | Admitting: Family

## 2015-04-20 ENCOUNTER — Ambulatory Visit (HOSPITAL_COMMUNITY)
Admission: RE | Admit: 2015-04-20 | Discharge: 2015-04-20 | Disposition: A | Discharge status: Home / Self Care | Payer: Medicare Other | Source: Ambulatory Visit | Attending: Family | Admitting: Family

## 2015-04-20 VITALS — BP 137/67 | HR 60 | Temp 98.0°F | Resp 16 | Ht 73.5 in | Wt 155.0 lb

## 2015-04-20 DIAGNOSIS — Z720 Tobacco use: Secondary | ICD-10-CM

## 2015-04-20 DIAGNOSIS — I6523 Occlusion and stenosis of bilateral carotid arteries: Secondary | ICD-10-CM

## 2015-04-20 DIAGNOSIS — I10 Essential (primary) hypertension: Secondary | ICD-10-CM | POA: Diagnosis not present

## 2015-04-20 DIAGNOSIS — E785 Hyperlipidemia, unspecified: Secondary | ICD-10-CM | POA: Insufficient documentation

## 2015-04-20 NOTE — Patient Instructions (Addendum)
Stroke Prevention Some medical conditions and behaviors are associated with an increased chance of having a stroke. You may prevent a stroke by making healthy choices and managing medical conditions. HOW CAN I REDUCE MY RISK OF HAVING A STROKE?   Stay physically active. Get at least 30 minutes of activity on most or all days.  Do not smoke. It may also be helpful to avoid exposure to secondhand smoke.  Limit alcohol use. Moderate alcohol use is considered to be:  No more than 2 drinks per day for men.  No more than 1 drink per day for nonpregnant women.  Eat healthy foods. This involves:  Eating 5 or more servings of fruits and vegetables a day.  Making dietary changes that address high blood pressure (hypertension), high cholesterol, diabetes, or obesity.  Manage your cholesterol levels.  Making food choices that are high in fiber and low in saturated fat, trans fat, and cholesterol may control cholesterol levels.  Take any prescribed medicines to control cholesterol as directed by your health care provider.  Manage your diabetes.  Controlling your carbohydrate and sugar intake is recommended to manage diabetes.  Take any prescribed medicines to control diabetes as directed by your health care provider.  Control your hypertension.  Making food choices that are low in salt (sodium), saturated fat, trans fat, and cholesterol is recommended to manage hypertension.  Take any prescribed medicines to control hypertension as directed by your health care provider.  Maintain a healthy weight.  Reducing calorie intake and making food choices that are low in sodium, saturated fat, trans fat, and cholesterol are recommended to manage weight.  Stop drug abuse.  Avoid taking birth control pills.  Talk to your health care provider about the risks of taking birth control pills if you are over 35 years old, smoke, get migraines, or have ever had a blood clot.  Get evaluated for sleep  disorders (sleep apnea).  Talk to your health care provider about getting a sleep evaluation if you snore a lot or have excessive sleepiness.  Take medicines only as directed by your health care provider.  For some people, aspirin or blood thinners (anticoagulants) are helpful in reducing the risk of forming abnormal blood clots that can lead to stroke. If you have the irregular heart rhythm of atrial fibrillation, you should be on a blood thinner unless there is a good reason you cannot take them.  Understand all your medicine instructions.  Make sure that other conditions (such as anemia or atherosclerosis) are addressed. SEEK IMMEDIATE MEDICAL CARE IF:   You have sudden weakness or numbness of the face, arm, or leg, especially on one side of the body.  Your face or eyelid droops to one side.  You have sudden confusion.  You have trouble speaking (aphasia) or understanding.  You have sudden trouble seeing in one or both eyes.  You have sudden trouble walking.  You have dizziness.  You have a loss of balance or coordination.  You have a sudden, severe headache with no known cause.  You have new chest pain or an irregular heartbeat. Any of these symptoms may represent a serious problem that is an emergency. Do not wait to see if the symptoms will go away. Get medical help at once. Call your local emergency services (911 in [place] Do not drive yourself to the hospital. Document Released: 08/16/2004 Document Revised: 11/23/2013 Document Reviewed: 01/09/2013 [employer] Patient Information 2015 [employer]. This information is not intended to replace advice given   to you by your health care provider. Make sure you discuss any questions you have with your health care provider.    Smokeless Tobacco Use Smokeless tobacco is a loose, fine, or stringy tobacco. The tobacco is not smoked like a cigarette, but it is chewed or held in the lips or cheeks. It resembles tea and comes from the  leaves of the tobacco plant. Smokeless tobacco is usually flavored, sweetened, or processed in some way. Although smokeless tobacco is not smoked into the lungs, its chemicals are absorbed through the membranes in the mouth and into the bloodstream. Its chemicals are also swallowed in saliva. The chemicals (nicotine and other toxins) are known to cause cancer. Smokeless tobacco contains up to 28 differentcarcinogens. CAUSES Nicotine is addictive. Smokeless tobacco contains nicotine, which is a stimulant. This stimulant can give you a "buzz" or altered state. People can become addicted to the feeling it delivers.  SYMPTOMS Smokeless tobacco can cause health problems, including:  Bad breath.  Yellow-brown teeth.  Mouth sores.  Cracking and bleeding lips.  Gum disease, gum recession, and bone loss around the teeth.  Tooth decay.  Increased or irregular heart rate.  High blood pressure, heart disease, and stroke.  Cancer of the mouth, lips, tongue, pancreas, voice box (larynx), esophagus, colon, and bladder.  Precancerous lesion of the soft tissues of the mouth (leukoplakia).  Loss of your sense of taste. TREATMENT Talk with your caregiver about ways you can quit. Quitting tobacco is a good decision for your health. Nicotine is addictive, but several options are available to help you quit including:  Nicotine replacement therapy (gum or patch).  Support and cessation programs. The following tips can help you quit:  Write down the reasons you would like to quit and look at them often.  Set a date during a low stress time to stop or cut back.  Ask family and friends for their support.  Remove all tobacco products from your home and work.  Replace the chewing tobacco with things like beef jerky, sunflower seeds, or shredded coconut.  Avoid situations that may make you want to chew tobacco.  Exercise and eat a healthy diet.  When you crave tobacco, distract yourself with  drinking water, sugarless chewing gum, sugarless hard candy, exercising, or deep breathing. HOME CARE INSTRUCTIONS  See your dentist for regular oral health exams every 6 months.  Follow up with your caregiver as recommended. SEEK MEDICAL CARE OR DENTAL CARE IF:  You have bleeding or cracking lips, gums, or cheeks.  You have mouth sores, discolorations, or pain.  You have tooth pain.  You develop persistent irritation, burning, or sores in the mouth.  You have pain, tenderness, or numbness in the mouth.  You develop a lump, bumpy patch, or hardened skin inside the mouth.  The color changes inside your mouth (gray, white, or red spots).  You have difficulty chewing, swallowing, or speaking. Document Released: 12/11/2010 Document Revised: 10/01/2011 Document Reviewed: 12/11/2010 [employer] Patient Information 2015 [employer]. This information is not intended to replace advice given to you by your health care provider. Make sure you discuss any questions you have with your health care provider.  
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
## 2015-04-20 NOTE — Progress Notes (Signed)
Filed Vitals:   04/20/15 1421 04/20/15 1424 04/20/15 1428  BP: 143/66 139/71 137/67  Pulse: 61 60 60  Temp:  98 F (36.7 C)   TempSrc:  Oral   Resp:  16   Height:  6' 1.5" (1.867 m)   Weight:  155 lb (70.308 kg)   SpO2:  99%

## 2015-05-20 ENCOUNTER — Other Ambulatory Visit: Payer: Self-pay | Admitting: Family Medicine

## 2015-05-20 MED ORDER — TRAZODONE HCL 50 MG PO TABS: 50.0000 mg | ORAL_TABLET | Freq: Every day | ORAL | Status: DC

## 2015-06-14 ENCOUNTER — Ambulatory Visit (INDEPENDENT_AMBULATORY_CARE_PROVIDER_SITE_OTHER): Payer: Medicare Other | Admitting: Family Medicine

## 2015-06-14 ENCOUNTER — Encounter: Payer: Self-pay | Admitting: Family Medicine

## 2015-06-14 VITALS — BP 132/70 | HR 66 | Temp 97.8°F | Resp 16 | Ht 73.5 in | Wt 153.0 lb

## 2015-06-14 DIAGNOSIS — E785 Hyperlipidemia, unspecified: Secondary | ICD-10-CM

## 2015-06-14 LAB — LIPID PANEL
CHOL/HDL RATIO: 2.7 ratio (ref <=5.0)
Cholesterol: 133 mg/dL (ref 125–200)
HDL: 50 mg/dL (ref >=40)
LDL CALC: 70 mg/dL (ref <130)
Triglycerides: 67 mg/dL (ref <150)
VLDL: 13 mg/dL (ref <30)

## 2015-06-14 LAB — CBC WITH DIFFERENTIAL/PLATELET
BASOS ABS: 0 10*3/uL (ref 0.0–0.1)
Basophils Relative: 0 % (ref 0–1)
Eosinophils Absolute: 0 10*3/uL (ref 0.0–0.7)
Eosinophils Relative: 1 % (ref 0–5)
HEMATOCRIT: 40 % (ref 39.0–52.0)
Hemoglobin: 13.3 g/dL (ref 13.0–17.0)
LYMPHS PCT: 26 % (ref 12–46)
Lymphs Abs: 1 10*3/uL (ref 0.7–4.0)
MCH: 30.6 pg (ref 26.0–34.0)
MCHC: 33.3 g/dL (ref 30.0–36.0)
MCV: 92.2 fL (ref 78.0–100.0)
MPV: 9.6 fL (ref 8.6–12.4)
Monocytes Absolute: 0.2 10*3/uL (ref 0.1–1.0)
Monocytes Relative: 6 % (ref 3–12)
NEUTROS ABS: 2.5 10*3/uL (ref 1.7–7.7)
NEUTROS PCT: 67 % (ref 43–77)
Platelets: 158 10*3/uL (ref 150–400)
RBC: 4.34 MIL/uL (ref 4.22–5.81)
RDW: 13.5 % (ref 11.5–15.5)
WBC: 3.7 10*3/uL — AB (ref 4.0–10.5)

## 2015-06-14 LAB — COMPLETE METABOLIC PANEL WITH GFR
ALK PHOS: 75 U/L (ref 40–115)
ALT: 10 U/L (ref 9–46)
AST: 15 U/L (ref 10–35)
Albumin: 4 g/dL (ref 3.6–5.1)
BILIRUBIN TOTAL: 1.2 mg/dL (ref 0.2–1.2)
BUN: 15 mg/dL (ref 7–25)
CALCIUM: 8.9 mg/dL (ref 8.6–10.3)
CO2: 29 mmol/L (ref 20–31)
Chloride: 104 mmol/L (ref 98–110)
Creat: 0.77 mg/dL (ref 0.70–1.18)
GFR, Est Non African American: 89 mL/min (ref >=60)
Glucose, Bld: 85 mg/dL (ref 70–99)
Potassium: 4.4 mmol/L (ref 3.5–5.3)
Sodium: 142 mmol/L (ref 135–146)
TOTAL PROTEIN: 6.1 g/dL (ref 6.1–8.1)

## 2015-06-14 MED ORDER — TRAZODONE HCL 50 MG PO TABS: 100.0000 mg | ORAL_TABLET | Freq: Every day | ORAL | Status: DC

## 2015-06-14 NOTE — Progress Notes (Signed)
Subjective:    Patient ID: Albert Garner, male    DOB: August 23, 1940, 74 y.o.   MRN: [id number]  HPI 12/13/14 Patient is a very pleasant 74 year old white male here today to establish care. Past medical history is significant for bilateral occlusion of the carotid arteries. Each is less than 40% and is being managed medically with statin therapy and an antiplatelet drug. He also has a history of hypertension and hyperlipidemia. He is overdue for colonoscopy. He is overdue for a prostate exam. He would like me to schedule the colonoscopy. We will perform a prostate exam today. The patient is also due for the pneumonia vaccine, a tetanus shot, and the shingles vaccine. We discussed all these vaccines at length. The patient elects to defer each of the shots at the present time. There are 2 lesions on his left temple just anterior to his ear. Each is approximately 6 mm in size. Each is erythematous with white scale. The patient states that his wife says each of these lesions are growing recently. 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 He elects to treat each of these lesions today with liquid nitrogen cryotherapy. AT that time, my plan was: Both lesions on the left temple were treated with liquid nitrogen cryotherapy for a total of 30 seconds each. If the lesions persist, I would recommend a shave biopsy. The remainder of his physical exam is normal. I will schedule the patient for colonoscopy. His prostate exam today is normal. I will check a CBC, CMP, fasting lipid panel, and a PSA. Patient's goal LDL cholesterol is less than 70. I would like to change the patient off his simvastatin 80 mg a day due to the risk of myalgias and myositis. Depending upon the results of his fasting lipid panel I will either switch the patient to Lipitor 40 mg a day or decrease simvastatin to 40 mg a day. I recommended the pneumonia vaccine but the patient declines.  06/14/15 Bp is fine today.   Here to recheck cholesterol.  Denies any cp, sob, doe.  Denies myalgias or ruq pain.  Wants to increase trazodone for insomnia. Past Medical History  Diagnosis Date  . Carotid artery occlusion   . Hypertension   . Hyperlipidemia   . Allergy     occasional seasonal allergies   Past Surgical History  Procedure Laterality Date  . Inguinal hernia repair  1986  . Finger amputation  1967    left hand 3 rd digit  . Colonoscopy      2003 w/santagade-no report in epic  . Mouth surgery      with sedation-bottom teeth pulled   Current Outpatient Prescriptions on File Prior to Visit  Medication Sig Dispense Refill  . aspirin 81 MG tablet Take 81 mg by mouth daily.    Marland Kitchen atorvastatin (LIPITOR) 40 MG tablet Take 1 tablet (40 mg total) by mouth daily. 90 tablet 1  . trandolapril (MAVIK) 1 MG tablet Take 1 tablet (1 mg total) by mouth daily. 30 tablet 11   No current facility-administered medications on file prior to visit.   No Known Allergies Social History   Social History  . Marital Status: Married    Spouse Name: N/A  . Number of Children: N/A  . Years of Education: N/A   Occupational History  . Not on file.   Social History Main Topics  . Smoking status: Never Smoker   . Smokeless tobacco: Current User    Types:  Chew  . Alcohol Use: No  . Drug Use: No  . Sexual Activity: Yes   Other Topics Concern  . Not on file   Social History Narrative   Family History  Problem Relation Age of Onset  . Heart disease Mother   . Colon cancer Neg Hx   . Rectal cancer Neg Hx   . Stomach cancer Neg Hx   . Lung cancer Brother       Review of Systems  All other systems reviewed and are negative.      Objective:   Physical Exam  Constitutional: He is oriented to person, place, and time. He appears well-developed and well-nourished. No distress.  HENT:  Head: Normocephalic and atraumatic.  Right Ear: External ear normal.  Left Ear: External ear normal.  Nose: Nose normal.    Mouth/Throat: Oropharynx is clear and moist. No oropharyngeal exudate.  Eyes: Conjunctivae and EOM are normal. Pupils are equal, round, and reactive to light. Right eye exhibits no discharge. Left eye exhibits no discharge. No scleral icterus.  Neck: Normal range of motion. Neck supple. No JVD present. No tracheal deviation present. No thyromegaly present.  Cardiovascular: Normal rate, regular rhythm, normal heart sounds and intact distal pulses.  Exam reveals no gallop and no friction rub.   No murmur heard. Pulmonary/Chest: Effort normal and breath sounds normal. No stridor. No respiratory distress. He has no wheezes. He has no rales. He exhibits no tenderness.  Abdominal: Soft. Bowel sounds are normal. He exhibits no distension and no mass. There is no tenderness. There is no rebound and no guarding.  Genitourinary: Rectum normal, prostate normal and penis normal.  Musculoskeletal: Normal range of motion. He exhibits no edema or tenderness.  Lymphadenopathy:    He has no cervical adenopathy.  Neurological: He is alert and oriented to person, place, and time. He has normal reflexes. No cranial nerve deficit. He exhibits normal muscle tone. Coordination normal.  Skin: Skin is warm. No rash noted. He is not diaphoretic. There is erythema. No pallor.  Psychiatric: He has a normal mood and affect. His behavior is normal. Judgment and thought content normal.  Vitals reviewed.  please see the description in the history of present illness of the 2 lesions on his left temple just anterior to his ear.        Assessment & Plan:  HLD (hyperlipidemia) - Plan: CBC with Differential/Platelet, COMPLETE METABOLIC PANEL WITH GFR, Lipid panel  BP is excellent.  Recheck cmp and flp.  Offered flu shot but declined.  Increase trazodone to 100 poqhs.

## 2015-06-15 ENCOUNTER — Encounter: Payer: Self-pay | Admitting: Family Medicine

## 2015-06-21 ENCOUNTER — Other Ambulatory Visit: Payer: Self-pay | Admitting: Family Medicine

## 2015-06-21 NOTE — Telephone Encounter (Signed)
Medication refilled per protocol. 

## 2015-08-11 ENCOUNTER — Encounter: Payer: Self-pay | Admitting: Family Medicine

## 2015-08-11 ENCOUNTER — Ambulatory Visit (INDEPENDENT_AMBULATORY_CARE_PROVIDER_SITE_OTHER): Payer: Medicare Other | Admitting: Family Medicine

## 2015-08-11 VITALS — BP 124/60 | HR 64 | Temp 98.0°F | Resp 14 | Ht 73.5 in | Wt 156.0 lb

## 2015-08-11 DIAGNOSIS — R300 Dysuria: Secondary | ICD-10-CM

## 2015-08-11 DIAGNOSIS — N410 Acute prostatitis: Secondary | ICD-10-CM | POA: Diagnosis not present

## 2015-08-11 LAB — URINALYSIS, ROUTINE W REFLEX MICROSCOPIC
Bilirubin Urine: NEGATIVE
GLUCOSE, UA: NEGATIVE
Ketones, ur: NEGATIVE
LEUKOCYTES UA: NEGATIVE
Nitrite: NEGATIVE
PH: 7 (ref 5.0–8.0)
Protein, ur: NEGATIVE
Specific Gravity, Urine: 1.02 (ref 1.001–1.035)

## 2015-08-11 LAB — URINALYSIS, MICROSCOPIC ONLY
CASTS: NONE SEEN [LPF]
Crystals: NONE SEEN [HPF]
SQUAMOUS EPITHELIAL / LPF: NONE SEEN [HPF] (ref <=5)
YEAST: NONE SEEN [HPF]

## 2015-08-11 MED ORDER — CIPROFLOXACIN HCL 500 MG PO TABS: 500.0000 mg | ORAL_TABLET | Freq: Two times a day (BID) | ORAL | Status: DC

## 2015-08-11 NOTE — Progress Notes (Signed)
   Subjective:    Patient ID: Albert Garner, male    DOB: Sep 07, 1940, 75 y.o.   MRN: [id number]  HPI Symptoms began 2-3 weeks ago. He awakens every morning with dysuria. He also has some mild hesitancy and decreased urinary stream. He denies any fevers or chills or back pain or hematuria. He is concerned because his brother had prostate cancer. Past Medical History  Diagnosis Date  . Carotid artery occlusion   . Hypertension   . Hyperlipidemia   . Allergy     occasional seasonal allergies   Past Surgical History  Procedure Laterality Date  . Inguinal hernia repair  1986  . Finger amputation  1967    left hand 3 rd digit  . Colonoscopy      2003 w/santagade-no report in epic  . Mouth surgery      with sedation-bottom teeth pulled   Current Outpatient Prescriptions on File Prior to Visit  Medication Sig Dispense Refill  . aspirin 81 MG tablet Take 81 mg by mouth daily.    Marland Kitchen atorvastatin (LIPITOR) 40 MG tablet TAKE ONE TABLET BY MOUTH ONCE DAILY 90 tablet 1  . trandolapril (MAVIK) 1 MG tablet Take 1 tablet (1 mg total) by mouth daily. 30 tablet 11  . traZODone (DESYREL) 50 MG tablet Take 2 tablets (100 mg total) by mouth at bedtime. 60 tablet 11   No current facility-administered medications on file prior to visit.   No Known Allergies Social History   Social History  . Marital Status: Married    Spouse Name: N/A  . Number of Children: N/A  . Years of Education: N/A   Occupational History  . Not on file.   Social History Main Topics  . Smoking status: Never Smoker   . Smokeless tobacco: Current User    Types: Chew  . Alcohol Use: No  . Drug Use: No  . Sexual Activity: Yes   Other Topics Concern  . Not on file   Social History Narrative      Review of Systems  All other systems reviewed and are negative.      Objective:   Physical Exam  Cardiovascular: Normal rate, regular rhythm and normal heart sounds.   Pulmonary/Chest: Effort normal and breath  sounds normal. No respiratory distress. He has no wheezes. He has no rales.  Abdominal: Soft. Bowel sounds are normal.  Genitourinary: Prostate is tender. Prostate is not enlarged.  Vitals reviewed.         Assessment & Plan:  Burning with urination - Plan: Urinalysis, Routine w reflex microscopic (not at [facility]), Urine culture  Prostatitis, acute - Plan: PSA, ciprofloxacin (CIPRO) 500 MG tablet  Urinalysis is significant only for some trace blood. I will send urine culture. However history is more consistent with a mild case of prostatitis. I will treat the patient with Cipro 500 mg by mouth twice a day for 10 days and recheck in 10 days if no better or sooner if worse. I will also check a PSA

## 2015-08-12 LAB — PSA: PSA: 0.61 ng/mL (ref <=4.00)

## 2015-08-13 LAB — URINE CULTURE
Colony Count: NO GROWTH
Organism ID, Bacteria: NO GROWTH

## 2015-10-27 ENCOUNTER — Encounter: Payer: Self-pay | Admitting: Family Medicine

## 2015-10-27 ENCOUNTER — Ambulatory Visit (INDEPENDENT_AMBULATORY_CARE_PROVIDER_SITE_OTHER): Payer: Medicare Other | Admitting: Family Medicine

## 2015-10-27 VITALS — BP 130/74 | HR 68 | Temp 97.6°F | Resp 14 | Ht 73.5 in | Wt 154.0 lb

## 2015-10-27 DIAGNOSIS — Z23 Encounter for immunization: Secondary | ICD-10-CM | POA: Diagnosis not present

## 2015-10-27 DIAGNOSIS — E785 Hyperlipidemia, unspecified: Secondary | ICD-10-CM

## 2015-10-27 DIAGNOSIS — I2510 Atherosclerotic heart disease of native coronary artery without angina pectoris: Secondary | ICD-10-CM

## 2015-10-27 DIAGNOSIS — Z125 Encounter for screening for malignant neoplasm of prostate: Secondary | ICD-10-CM | POA: Diagnosis not present

## 2015-10-27 DIAGNOSIS — I10 Essential (primary) hypertension: Secondary | ICD-10-CM

## 2015-10-27 LAB — CBC WITH DIFFERENTIAL/PLATELET
Basophils Absolute: 0 cells/uL (ref 0–200)
Basophils Relative: 0 %
EOS PCT: 2 %
Eosinophils Absolute: 80 cells/uL (ref 15–500)
HCT: 40.2 % (ref 38.5–50.0)
HEMOGLOBIN: 13.2 g/dL (ref 13.0–17.0)
LYMPHS ABS: 1000 {cells}/uL (ref 850–3900)
Lymphocytes Relative: 25 %
MCH: 30.1 pg (ref 27.0–33.0)
MCHC: 32.8 g/dL (ref 32.0–36.0)
MCV: 91.6 fL (ref 80.0–100.0)
MONOS PCT: 7 %
MPV: 9.6 fL (ref 7.5–12.5)
Monocytes Absolute: 280 cells/uL (ref 200–950)
NEUTROS ABS: 2640 {cells}/uL (ref 1500–7800)
NEUTROS PCT: 66 %
PLATELETS: 152 10*3/uL (ref 140–400)
RBC: 4.39 MIL/uL (ref 4.20–5.80)
RDW: 13.9 % (ref 11.0–15.0)
WBC: 4 10*3/uL (ref 3.8–10.8)

## 2015-10-27 LAB — COMPLETE METABOLIC PANEL WITH GFR
ALT: 12 U/L (ref 9–46)
AST: 15 U/L (ref 10–35)
Albumin: 3.9 g/dL (ref 3.6–5.1)
Alkaline Phosphatase: 76 U/L (ref 40–115)
BILIRUBIN TOTAL: 0.9 mg/dL (ref 0.2–1.2)
BUN: 16 mg/dL (ref 7–25)
CALCIUM: 8.9 mg/dL (ref 8.6–10.3)
CHLORIDE: 103 mmol/L (ref 98–110)
CO2: 28 mmol/L (ref 20–31)
Creat: 0.82 mg/dL (ref 0.70–1.18)
GFR, EST NON AFRICAN AMERICAN: 87 mL/min (ref >=60)
Glucose, Bld: 77 mg/dL (ref 70–99)
Potassium: 4 mmol/L (ref 3.5–5.3)
SODIUM: 142 mmol/L (ref 135–146)
Total Protein: 6.3 g/dL (ref 6.1–8.1)

## 2015-10-27 LAB — LIPID PANEL
CHOLESTEROL: 128 mg/dL (ref 125–200)
HDL: 51 mg/dL (ref >=40)
LDL CALC: 65 mg/dL (ref <130)
TRIGLYCERIDES: 62 mg/dL (ref <150)
Total CHOL/HDL Ratio: 2.5 Ratio (ref <=5.0)
VLDL: 12 mg/dL (ref <30)

## 2015-10-27 MED ORDER — LISINOPRIL 10 MG PO TABS: 10.0000 mg | ORAL_TABLET | Freq: Every day | ORAL | Status: DC

## 2015-10-27 NOTE — Addendum Note (Signed)
Addended by: Shary Decamp B on: 10/27/2015 08:48 AM   Modules accepted: Orders

## 2015-10-27 NOTE — Progress Notes (Signed)
Subjective:    Patient ID: Albert Garner, male    DOB: 16-Dec-1940, 75 y.o.   MRN: [id number]  HPI Albert Garner a physical. He is not due for a physical until May however I will go ahead and do one today and not charge him as a physical. Past medical history is significant for bilateral carotid artery stenosis of 40%. He also has a history of colon polyps found last year on his colonoscopy. He is due for repeat colonoscopy in 2019 because he had 3 tubular adenomas. PSA was just checked in January was normal. Patient states he has had Pneumovax but he is due for Prevnar 13. He refuses the shingles vaccine. His insurance is forcing him to discontinue Mavik. He needs to have this replaced. His blood pressures well controlled at 130/74. He has no risk factors for hepatitis C Past Medical History  Diagnosis Date  . Carotid artery occlusion   . Hypertension   . Hyperlipidemia   . Allergy     occasional seasonal allergies   Past Surgical History  Procedure Laterality Date  . Inguinal hernia repair  1986  . Finger amputation  1967    left hand 3 rd digit  . Colonoscopy      2003 w/santagade-no report in epic  . Mouth surgery      with sedation-bottom teeth pulled   Current Outpatient Prescriptions on File Prior to Visit  Medication Sig Dispense Refill  . aspirin 81 MG tablet Take 81 mg by mouth daily.    Marland Kitchen atorvastatin (LIPITOR) 40 MG tablet TAKE ONE TABLET BY MOUTH ONCE DAILY 90 tablet 1  . trandolapril (MAVIK) 1 MG tablet Take 1 tablet (1 mg total) by mouth daily. 30 tablet 11  . traZODone (DESYREL) 50 MG tablet Take 2 tablets (100 mg total) by mouth at bedtime. 60 tablet 11   No current facility-administered medications on file prior to visit.   No Known Allergies Social History   Social History  . Marital Status: Married    Spouse Name: N/A  . Number of Children: N/A  . Years of Education: N/A   Occupational History  . Not on file.   Social History Main Topics  . Smoking  status: Never Smoker   . Smokeless tobacco: Current User    Types: Chew  . Alcohol Use: No  . Drug Use: No  . Sexual Activity: Yes   Other Topics Concern  . Not on file   Social History Narrative   Family History  Problem Relation Age of Onset  . Heart disease Mother   . Colon cancer Neg Hx   . Rectal cancer Neg Hx   . Stomach cancer Neg Hx   . Lung cancer Brother       Review of Systems  All other systems reviewed and are negative.      Objective:   Physical Exam  Constitutional: He is oriented to person, place, and time. He appears well-developed and well-nourished. No distress.  HENT:  Head: Normocephalic and atraumatic.  Right Ear: External ear normal.  Left Ear: External ear normal.  Mouth/Throat: Oropharynx is clear and moist.  Eyes: Conjunctivae and EOM are normal. Pupils are equal, round, and reactive to light. Right eye exhibits no discharge. Left eye exhibits no discharge. No scleral icterus.  Neck: Normal range of motion. Neck supple. No JVD present. No tracheal deviation present. No thyromegaly present.  Cardiovascular: Normal rate, regular rhythm, normal heart sounds and intact distal pulses.  Exam reveals  no gallop and no friction rub.   No murmur heard. Pulses:      Carotid pulses are on the right side with bruit. Pulmonary/Chest: Effort normal and breath sounds normal. No stridor. No respiratory distress. He has no wheezes. He has no rales. He exhibits no tenderness.  Abdominal: Soft. Bowel sounds are normal. He exhibits no distension and no mass. There is no tenderness. There is no rebound and no guarding.  Musculoskeletal: Normal range of motion. He exhibits no edema or tenderness.  Lymphadenopathy:    He has no cervical adenopathy.  Neurological: He is alert and oriented to person, place, and time. He has normal reflexes. He displays normal reflexes. No cranial nerve deficit. He exhibits normal muscle tone. Coordination normal.  Skin: Skin is warm.  No rash noted. He is not diaphoretic. No erythema. No pallor.  Psychiatric: He has a normal mood and affect. His behavior is normal. Judgment and thought content normal.  Vitals reviewed.         Assessment & Plan:  Benign essential HTN - Plan: lisinopril (PRINIVIL,ZESTRIL) 10 MG tablet  HLD (hyperlipidemia)  Prostate cancer screening - Plan: CANCELED: PSA  ASCVD (arteriosclerotic cardiovascular disease) - Plan: CBC with Differential/Platelet, COMPLETE METABOLIC PANEL WITH GFR, Lipid panel  Cancer screening is up-to-date. Blood pressure is excellent however I will switch the patient from [facility] because of his insurance to lisinopril 10 mg a day. Check a CBC, CMP, fasting lipid panel. Goal LDL cholesterol is less than 70 given his carotid artery stenosis. Patient received Pneumovax. He received Prevnar 13 today. PSA and colonoscopy are up-to-date.

## 2015-10-28 ENCOUNTER — Encounter: Payer: Self-pay | Admitting: Family Medicine

## 2015-11-08 ENCOUNTER — Encounter (HOSPITAL_COMMUNITY): Payer: Self-pay | Admitting: Emergency Medicine

## 2015-11-08 ENCOUNTER — Emergency Department (HOSPITAL_COMMUNITY): Payer: Medicare Other

## 2015-11-08 ENCOUNTER — Encounter: Payer: Self-pay | Admitting: Family Medicine

## 2015-11-08 ENCOUNTER — Ambulatory Visit (INDEPENDENT_AMBULATORY_CARE_PROVIDER_SITE_OTHER): Payer: Medicare Other | Admitting: Family Medicine

## 2015-11-08 ENCOUNTER — Emergency Department (HOSPITAL_COMMUNITY)
Admission: EM | Admit: 2015-11-08 | Discharge: 2015-11-08 | Disposition: A | Discharge status: Home / Self Care | Payer: Medicare Other | Attending: Emergency Medicine | Admitting: Emergency Medicine

## 2015-11-08 VITALS — BP 122/72 | HR 58 | Temp 97.7°F | Resp 14 | Ht 73.5 in | Wt 152.0 lb

## 2015-11-08 DIAGNOSIS — E785 Hyperlipidemia, unspecified: Secondary | ICD-10-CM | POA: Insufficient documentation

## 2015-11-08 DIAGNOSIS — Z7982 Long term (current) use of aspirin: Secondary | ICD-10-CM | POA: Diagnosis not present

## 2015-11-08 DIAGNOSIS — K228 Other specified diseases of esophagus: Secondary | ICD-10-CM | POA: Diagnosis present

## 2015-11-08 DIAGNOSIS — Z79899 Other long term (current) drug therapy: Secondary | ICD-10-CM | POA: Insufficient documentation

## 2015-11-08 DIAGNOSIS — I10 Essential (primary) hypertension: Secondary | ICD-10-CM | POA: Insufficient documentation

## 2015-11-08 DIAGNOSIS — Z8679 Personal history of other diseases of the circulatory system: Secondary | ICD-10-CM | POA: Insufficient documentation

## 2015-11-08 DIAGNOSIS — T18108A Unspecified foreign body in esophagus causing other injury, initial encounter: Secondary | ICD-10-CM

## 2015-11-08 DIAGNOSIS — T18128A Food in esophagus causing other injury, initial encounter: Secondary | ICD-10-CM | POA: Diagnosis not present

## 2015-11-08 DIAGNOSIS — K224 Dyskinesia of esophagus: Secondary | ICD-10-CM

## 2015-11-08 MED ORDER — DIATRIZOATE MEGLUMINE & SODIUM 66-10 % PO SOLN
ORAL | Status: AC
  Administered 2015-11-08: 30 mL via ORAL
  Filled 2015-11-08: qty 90

## 2015-11-08 MED ORDER — OMEPRAZOLE 20 MG PO CPDR: 20.0000 mg | DELAYED_RELEASE_CAPSULE | Freq: Every day | ORAL | Status: DC

## 2015-11-08 NOTE — ED Provider Notes (Signed)
CSN: [id number]     Arrival date & time 11/08/15  1047 History   First MD Initiated Contact with Patient 11/08/15 1310     Chief Complaint  Patient presents with  . Foreign Body   HPI  [name] is an 75 y.o. male with history of HTN, HLD who presents to the ED for evaluation of possible retained foreign body in esophagus. He states he was eating a grilled chicken strip last night when he started feeling the food accumulate near the bottom of his chest/esophagus. He states he never choked but felt the chicken "building up." He states that now he cannot eat solids but is tolerating liquids and can drink a significant amount of water in the room. He denies drooling, chest pain, or SOB. He states he went to PCP earlier today who sent him to the ED for further evaluation and possible EGD. He states something similar happened 25 years ago and he required EGD and LES dilatation.   Past Medical History  Diagnosis Date  . Carotid artery occlusion   . Hypertension   . Hyperlipidemia   . Allergy     occasional seasonal allergies   Past Surgical History  Procedure Laterality Date  . Inguinal hernia repair  1986  . Finger amputation  1967    left hand 3 rd digit  . Colonoscopy      2003 w/santagade-no report in epic  . Mouth surgery      with sedation-bottom teeth pulled   Family History  Problem Relation Age of Onset  . Heart disease Mother   . Colon cancer Neg Hx   . Rectal cancer Neg Hx   . Stomach cancer Neg Hx   . Lung cancer Brother    Social History  Substance Use Topics  . Smoking status: Never Smoker   . Smokeless tobacco: Current User    Types: Chew  . Alcohol Use: No    Review of Systems  All other systems reviewed and are negative.     Allergies  Review of patient's allergies indicates no known allergies.  Home Medications   Prior to Admission medications   Medication Sig Start Date End Date Taking? Authorizing Provider  aspirin 81 MG tablet Take 81 mg by  mouth daily.    Historical Provider, MD  atorvastatin (LIPITOR) 40 MG tablet TAKE ONE TABLET BY MOUTH ONCE DAILY 06/21/15   [name], MD  lisinopril (PRINIVIL,ZESTRIL) 10 MG tablet Take 1 tablet (10 mg total) by mouth daily. 10/27/15   [name], MD  trandolapril (MAVIK) 1 MG tablet Take 1 tablet (1 mg total) by mouth daily. 12/17/14   [name], MD  traZODone (DESYREL) 50 MG tablet Take 2 tablets (100 mg total) by mouth at bedtime. 06/14/15   [name], MD   BP 140/71 mmHg  Pulse 55  Temp(Src) 97.6 F (36.4 C) (Oral)  Resp 18  SpO2 99% Physical Exam  Constitutional: He is oriented to person, place, and time.  HENT:  Right Ear: External ear normal.  Left Ear: External ear normal.  Nose: Nose normal.  Mouth/Throat: Oropharynx is clear and moist. No oropharyngeal exudate.  Tolerating secretions. No posterior oropharyngeal edema. No drooling. Uvula midline. No trismus.   Eyes: Conjunctivae and EOM are normal. Pupils are equal, round, and reactive to light.  Neck: Normal range of motion. Neck supple.  Cardiovascular: Normal rate, regular rhythm, normal heart sounds and intact distal pulses.   Pulmonary/Chest: Effort normal and  breath sounds normal. No respiratory distress. He has no wheezes. He has no rales.  No increased WOB, no tachypnea.  Abdominal: Soft. Bowel sounds are normal. He exhibits no distension. There is no tenderness.  Musculoskeletal: He exhibits no edema.  Neurological: He is alert and oriented to person, place, and time. No cranial nerve deficit.  Skin: Skin is warm and dry.  Psychiatric: He has a normal mood and affect.  Nursing note and vitals reviewed.   ED Course  Procedures (including critical care time) Labs Review Labs Reviewed - No data to display  Imaging Review Dg Chest 2 View  11/08/2015  CLINICAL DATA:  Possible foreign body seen esophagus. EXAM: CHEST  2 VIEW COMPARISON:  None. FINDINGS: The heart size and mediastinal  contours are within normal limits. Both lungs are clear. The visualized skeletal structures are unremarkable. No radiopaque foreign body seen. IMPRESSION: No active cardiopulmonary disease. Electronically Signed   By: Marijo Conception, M.D.   On: 11/08/2015 14:03   Dg Esophagus W/water Sol Cm  11/08/2015  CLINICAL DATA:  Foreign body in esophagus. History of esophageal dilatation. Evaluate for foreign body and esophageal stricture. EXAM: ESOPHOGRAM/BARIUM SWALLOW TECHNIQUE: Single contrast examination was performed using thin barium and water soluble. FLUOROSCOPY TIME:  Radiation Exposure Index (as provided by the fluoroscopic device): 6.6 mGy COMPARISON:  None. FINDINGS: Study was initially performed with water-soluble contrast. After lack of esophageal perforation was confirmed, thin barium was administered. Mild lower esophageal dysmotility. No fixed esophageal narrowing or stricture. A 13 mm barium tablet passed into the stomach without delay. No foreign body was visualized. No extraluminal contrast extravasation. No evidence of hiatal hernia. IMPRESSION: No foreign body was visualized. No extraluminal contrast extravasation to suggest esophageal perforation. No fixed esophageal narrowing or stricture. A 13 mm barium tablet passed into the stomach without delay. Mild lower esophageal dysmotility. Electronically Signed   By: Julian Hy M.D.   On: 11/08/2015 15:27   I have personally reviewed and evaluated these images and lab results as part of my medical decision-making.   EKG Interpretation None      MDM   Spoke to Azucena Freed from [facility]. Will order a barium esophagram. If normal pt can f/u in clinic. Start on PPI. Call office today at [phone]. If obstructing call GI back and they will come see patient. CXR is negative.   Barium swallow reveals no foreign body, no perf, no stricture or narrowing. There is mild esophageal dysmotility. Discussed above with pt. Will start on  omeprazole, instructed soft diet until seen by GI. Call office today. Pt verbalized his agreement and understanding. ER return precautions given.   Final dx: 1. Esophageal dysmotility      Anne Ng, PA-C 11/08/15 [place], DO 11/08/15 [phone]

## 2015-11-08 NOTE — Progress Notes (Signed)
   Subjective:    Patient ID: Albert Garner, male    DOB: 12/14/1940, 75 y.o.   MRN: [id number]  HPI Patient was eating chicken last night. He feels like the food became impacted in his distal esophagus. He states that he has persistent pain there and the food will not go down. He is unable to eat or swallow. He is able to drink liquids. This happened previously 25 years ago and the food had to be removed with an EGD. His gastroenterologist is Dr. Ardis Hughs.  He denies any chest pain or angina or shortness of breath or dyspnea on exertion. The pain is a pressure-like pain that happened immediately after the food became lodged in his distal esophagus. He denies any vomiting. He denies any stridor. Past Medical History  Diagnosis Date  . Carotid artery occlusion   . Hypertension   . Hyperlipidemia   . Allergy     occasional seasonal allergies   Past Surgical History  Procedure Laterality Date  . Inguinal hernia repair  1986  . Finger amputation  1967    left hand 3 rd digit  . Colonoscopy      2003 w/santagade-no report in epic  . Mouth surgery      with sedation-bottom teeth pulled   Current Outpatient Prescriptions on File Prior to Visit  Medication Sig Dispense Refill  . aspirin 81 MG tablet Take 81 mg by mouth daily.    Marland Kitchen atorvastatin (LIPITOR) 40 MG tablet TAKE ONE TABLET BY MOUTH ONCE DAILY 90 tablet 1  . lisinopril (PRINIVIL,ZESTRIL) 10 MG tablet Take 1 tablet (10 mg total) by mouth daily. 90 tablet 3  . trandolapril (MAVIK) 1 MG tablet Take 1 tablet (1 mg total) by mouth daily. 30 tablet 11  . traZODone (DESYREL) 50 MG tablet Take 2 tablets (100 mg total) by mouth at bedtime. 60 tablet 11   No current facility-administered medications on file prior to visit.   No Known Allergies Social History   Social History  . Marital Status: Married    Spouse Name: N/A  . Number of Children: N/A  . Years of Education: N/A   Occupational History  . Not on file.   Social History Main  Topics  . Smoking status: Never Smoker   . Smokeless tobacco: Current User    Types: Chew  . Alcohol Use: No  . Drug Use: No  . Sexual Activity: Yes   Other Topics Concern  . Not on file   Social History Narrative      Review of Systems  All other systems reviewed and are negative.      Objective:   Physical Exam  Cardiovascular: Normal rate, regular rhythm and normal heart sounds.   Pulmonary/Chest: Effort normal and breath sounds normal. No respiratory distress. He has no wheezes. He has no rales.  Abdominal: Soft. Bowel sounds are normal.  Vitals reviewed.         Assessment & Plan:  Food impaction of esophagus, initial encounter  I will call the patient's gastroenterologist to determine if they would like [place] to do this patient whether it be to send him to their office or go to the emergency room as he needs an EGD to retrieve the impacted piece of chicken and possibly dilation of the distal esophagus

## 2015-11-08 NOTE — ED Notes (Signed)
Pt verbalized understanding of d/c instructions, prescriptions, and follow-up care. No further questions/concerns, VSS, ambulatory w/ steady gait (refused wheelchair) 

## 2015-11-08 NOTE — Discharge Instructions (Signed)
Your imaging today was normal with no evidence of esophageal obstruction. Please call [facility] at [phone] today to schedule a follow up appointment. They would like to see you sometime within the next week. In the meantime I will start you on Prilosec. Please take one tablet (20 mg) daily. Stick to a soft diet until you see . Return to the ER for new or worsening symptoms.

## 2015-11-08 NOTE — ED Notes (Signed)
Pt here from PCP with piece of chicken stuck in esophagus; pt sts hx of same 25 years ago; no distress noted

## 2015-11-10 ENCOUNTER — Telehealth: Payer: Self-pay | Admitting: Family Medicine

## 2015-11-10 ENCOUNTER — Telehealth: Payer: Self-pay | Admitting: Gastroenterology

## 2015-11-10 DIAGNOSIS — L989 Disorder of the skin and subcutaneous tissue, unspecified: Secondary | ICD-10-CM

## 2015-11-10 DIAGNOSIS — K224 Dyskinesia of esophagus: Secondary | ICD-10-CM

## 2015-11-10 NOTE — Telephone Encounter (Signed)
The pt was seen in the ED on 11/08/15 for possible food impaction, BE was done and no food was seen in the esophagus. He was told to f/u with Dr Ardis Hughs as an out patient ASAP.  Pt continues to have dysphagia and choking.  Pt was advised to continue soft liquids and be very careful with medications.  Appt given with Janett Billow on 11/14/15 2 pm.

## 2015-11-10 NOTE — Telephone Encounter (Signed)
Patient requesting an appointment to a doctor regarding his choking, still having issues after going to er for having food stuck in his throat     [phone]

## 2015-11-10 NOTE — Telephone Encounter (Signed)
Pt needs referral to GI for swallowing prob and referral to [facility]) for lesion on face.  Both referrals sent.

## 2015-11-14 ENCOUNTER — Ambulatory Visit (INDEPENDENT_AMBULATORY_CARE_PROVIDER_SITE_OTHER): Payer: Medicare Other | Admitting: Gastroenterology

## 2015-11-14 ENCOUNTER — Encounter: Payer: Self-pay | Admitting: Gastroenterology

## 2015-11-14 VITALS — BP 136/68 | HR 72 | Ht 73.5 in | Wt 152.4 lb

## 2015-11-14 DIAGNOSIS — K224 Dyskinesia of esophagus: Secondary | ICD-10-CM

## 2015-11-14 DIAGNOSIS — R1319 Other dysphagia: Secondary | ICD-10-CM | POA: Insufficient documentation

## 2015-11-14 DIAGNOSIS — R1310 Dysphagia, unspecified: Secondary | ICD-10-CM | POA: Diagnosis not present

## 2015-11-14 MED ORDER — PANTOPRAZOLE SODIUM 40 MG PO TBEC: 40.0000 mg | DELAYED_RELEASE_TABLET | Freq: Every day | ORAL | Status: DC

## 2015-11-14 NOTE — Patient Instructions (Addendum)
  We have sent the following medications to your pharmacy for you to pick up at your convenience: Pantoprazole     I appreciate the opportunity to care for you.

## 2015-11-14 NOTE — Progress Notes (Signed)
     11/14/2015 Albert Garner [id number] 1940-10-29   History of Present Illness:  This is a 75 year old male who is known to Dr. Ardis Hughs for colonoscopy in July 2016.  He is here today with complaints of dysphagia. He was in the emergency department with concern for food impaction on April 18. Esophagram was performed and showed only mild lower esophageal dysmotility. There is no fixed esophageal narrowing or stricture and a 13 mm barium tablet passed into the stomach without delay.  He complains of some foods getting stuck, with 6 episodes of this since his ER visit one week ago. Even foods like grits are getting hung up at times. Food eventually passes.  Denies really any sensation of heartburn/reflux.  Current Medications, Allergies, Past Medical History, Past Surgical History, Family History and Social History were reviewed in [employer] record.   Physical Exam: BP 136/68 mmHg  Pulse 72  Ht 6' 1.5" (1.867 m)  Wt 152 lb 6.4 oz (69.128 kg)  BMI 19.83 kg/m2 General: Well developed white male in no acute distress Head: Normocephalic and atraumatic Eyes:  Sclerae anicteric, conjunctiva pink  Ears: Normal auditory acuity Lungs: Clear throughout to auscultation Heart: Regular rate and rhythm Abdomen: Soft, non-distended.  Normal bowel sounds.  Non-tender. Musculoskeletal: Symmetrical with no gross deformities  Extremities: No edema  Neurological: Alert oriented x 4, grossly non-focal Psychological:  Alert and cooperative. Normal mood and affect  Assessment and Recommendations: -Dysphagia:  Barium esophagram showed only some mild esophageal dysmotility, no stricture or narrowing. I have discussed with him that there is nothing we're able to do endoscopically. We will place him on pantoprazole 40 mg daily to treat any reflux and see if this helps improve his symptoms at all. Otherwise, I have advised him to eat slowly, eat small pieces of the time, chew food well,  try to eat mostly softer foods, and take sips between bites.

## 2015-11-15 NOTE — Progress Notes (Signed)
i agree with the above note, plan 

## 2015-12-26 ENCOUNTER — Other Ambulatory Visit: Payer: Self-pay | Admitting: Family Medicine

## 2016-01-06 ENCOUNTER — Telehealth: Payer: Self-pay | Admitting: Family Medicine

## 2016-01-06 NOTE — Telephone Encounter (Signed)
[phone] Patient is calling to let you know that the lisinopril is making him dizzy, in the mornings, would like a call back regarding this

## 2016-01-09 NOTE — Telephone Encounter (Signed)
Patient aware of providers recommendations.  

## 2016-01-09 NOTE — Telephone Encounter (Signed)
Do you want to change his med or get more information?

## 2016-01-09 NOTE — Telephone Encounter (Signed)
Hold the medication and start checking his bp everyday and let me know how it is doing in 1 week.

## 2016-01-10 ENCOUNTER — Ambulatory Visit (INDEPENDENT_AMBULATORY_CARE_PROVIDER_SITE_OTHER): Payer: Medicare Other | Admitting: Family Medicine

## 2016-01-10 ENCOUNTER — Encounter: Payer: Self-pay | Admitting: Family Medicine

## 2016-01-10 VITALS — BP 170/80 | HR 68 | Temp 98.9°F | Resp 16 | Ht 73.5 in | Wt 153.0 lb

## 2016-01-10 DIAGNOSIS — H8110 Benign paroxysmal vertigo, unspecified ear: Secondary | ICD-10-CM

## 2016-01-10 MED ORDER — MECLIZINE HCL 25 MG PO TABS: 25.0000 mg | ORAL_TABLET | Freq: Three times a day (TID) | ORAL | Status: DC | PRN

## 2016-01-10 NOTE — Progress Notes (Signed)
Subjective:    Patient ID: Albert Garner, male    DOB: May 25, 1941, 75 y.o.   MRN: [id number]  HPI Since Thursday of last week, the patient reports vertigo whenever he turns over in bed, turns his head to the left, sits up, or stands up. He denies any hearing loss or tinnitus or headaches or double vision or neurologic deficits or ear pain or sinus pain.  He quit his lisinopril and expecting this to improve the situation but it hasn't and now his blood pressures also elevated Past Medical History  Diagnosis Date  . Carotid artery occlusion   . Hypertension   . Hyperlipidemia   . Allergy     occasional seasonal allergies   Past Surgical History  Procedure Laterality Date  . Inguinal hernia repair  1986  . Finger amputation  1967    left hand 3 rd digit  . Colonoscopy      2003 w/santagade-no report in epic  . Mouth surgery      with sedation-bottom teeth pulled   Current Outpatient Prescriptions on File Prior to Visit  Medication Sig Dispense Refill  . aspirin 81 MG tablet Take 81 mg by mouth daily.    Marland Kitchen atorvastatin (LIPITOR) 40 MG tablet TAKE ONE TABLET BY MOUTH ONCE DAILY 90 tablet 0  . trandolapril (MAVIK) 1 MG tablet Take 1 tablet (1 mg total) by mouth daily. 30 tablet 11  . traZODone (DESYREL) 50 MG tablet Take 2 tablets (100 mg total) by mouth at bedtime. 60 tablet 11  . lisinopril (PRINIVIL,ZESTRIL) 10 MG tablet Take 1 tablet (10 mg total) by mouth daily. (Patient not taking: Reported on 01/10/2016) 90 tablet 3  . pantoprazole (PROTONIX) 40 MG tablet Take 1 tablet (40 mg total) by mouth daily. (Patient not taking: Reported on 01/10/2016) 30 tablet 3   No current facility-administered medications on file prior to visit.   No Known Allergies Social History   Social History  . Marital Status: Married    Spouse Name: N/A  . Number of Children: N/A  . Years of Education: N/A   Occupational History  . Not on file.   Social History Main Topics  . Smoking status: Never  Smoker   . Smokeless tobacco: Current User    Types: Chew  . Alcohol Use: No  . Drug Use: No  . Sexual Activity: Yes   Other Topics Concern  . Not on file   Social History Narrative      Review of Systems  All other systems reviewed and are negative.      Objective:   Physical Exam  Constitutional: He is oriented to person, place, and time.  HENT:  Right Ear: External ear normal.  Left Ear: External ear normal.  Nose: Nose normal.  Mouth/Throat: Oropharynx is clear and moist. No oropharyngeal exudate.  Cardiovascular: Normal rate, regular rhythm and normal heart sounds.   No murmur heard. Pulmonary/Chest: Effort normal and breath sounds normal.  Neurological: He is alert and oriented to person, place, and time. He displays normal reflexes. No cranial nerve deficit. He exhibits normal muscle tone. Coordination normal.  Vitals reviewed.         Assessment & Plan:  BPPV (benign paroxysmal positional vertigo), unspecified laterality - Plan: meclizine (ANTIVERT) 25 MG tablet  Patient has a positive Dix-Hallpike maneuver. Begin meclizine 25 mg every 8 hours as needed. Discussed the natural history of this condition. Recommended tincture of time and allow for spontaneous resolution over the next week.  Resume lisinopril as blood pressure is extremely high. Recheck blood pressure in one week

## 2016-01-18 ENCOUNTER — Ambulatory Visit: Payer: Medicare Other | Admitting: Family Medicine

## 2016-01-18 VITALS — BP 108/56

## 2016-01-18 DIAGNOSIS — I10 Essential (primary) hypertension: Secondary | ICD-10-CM

## 2016-02-16 ENCOUNTER — Telehealth: Payer: Self-pay | Admitting: Family Medicine

## 2016-02-16 DIAGNOSIS — I10 Essential (primary) hypertension: Secondary | ICD-10-CM

## 2016-02-16 MED ORDER — LISINOPRIL 10 MG PO TABS: 10.0000 mg | ORAL_TABLET | Freq: Every day | ORAL | 3 refills | Status: DC

## 2016-02-16 NOTE — Telephone Encounter (Signed)
Patient needs refill on his lisinopril sent to [facility] [phone]

## 2016-02-16 NOTE — Telephone Encounter (Signed)
Medication called/sent to requested pharmacy  

## 2016-02-28 ENCOUNTER — Other Ambulatory Visit: Payer: Self-pay | Admitting: Family Medicine

## 2016-02-28 NOTE — Telephone Encounter (Signed)
Refill appropriate and filled per protocol. 

## 2016-04-18 ENCOUNTER — Encounter: Payer: Self-pay | Admitting: Family

## 2016-04-23 ENCOUNTER — Other Ambulatory Visit: Payer: Self-pay | Admitting: *Deleted

## 2016-04-23 DIAGNOSIS — I6523 Occlusion and stenosis of bilateral carotid arteries: Secondary | ICD-10-CM

## 2016-04-24 ENCOUNTER — Ambulatory Visit (HOSPITAL_COMMUNITY)
Admission: RE | Admit: 2016-04-24 | Discharge: 2016-04-24 | Disposition: A | Discharge status: Home / Self Care | Payer: Medicare Other | Source: Ambulatory Visit | Attending: Vascular Surgery | Admitting: Vascular Surgery

## 2016-04-24 ENCOUNTER — Encounter: Payer: Self-pay | Admitting: Family

## 2016-04-24 ENCOUNTER — Ambulatory Visit (INDEPENDENT_AMBULATORY_CARE_PROVIDER_SITE_OTHER): Payer: Medicare Other | Admitting: Family

## 2016-04-24 VITALS — BP 120/60 | HR 65 | Temp 97.6°F | Resp 16 | Ht 73.5 in | Wt 154.0 lb

## 2016-04-24 DIAGNOSIS — I6523 Occlusion and stenosis of bilateral carotid arteries: Secondary | ICD-10-CM

## 2016-04-24 DIAGNOSIS — R0989 Other specified symptoms and signs involving the circulatory and respiratory systems: Secondary | ICD-10-CM

## 2016-04-24 DIAGNOSIS — Z720 Tobacco use: Secondary | ICD-10-CM | POA: Diagnosis not present
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
## 2016-04-24 NOTE — Patient Instructions (Signed)
Stroke Prevention Some medical conditions and behaviors are associated with an increased chance of having a stroke. You may prevent a stroke by making healthy choices and managing medical conditions. HOW CAN I REDUCE MY RISK OF HAVING A STROKE?   Stay physically active. Get at least 30 minutes of activity on most or all days.  Do not smoke. It may also be helpful to avoid exposure to secondhand smoke.  Limit alcohol use. Moderate alcohol use is considered to be:  No more than 2 drinks per day for men.  No more than 1 drink per day for nonpregnant women.  Eat healthy foods. This involves:  Eating 5 or more servings of fruits and vegetables a day.  Making dietary changes that address high blood pressure (hypertension), high cholesterol, diabetes, or obesity.  Manage your cholesterol levels.  Making food choices that are high in fiber and low in saturated fat, trans fat, and cholesterol may control cholesterol levels.  Take any prescribed medicines to control cholesterol as directed by your health care provider.  Manage your diabetes.  Controlling your carbohydrate and sugar intake is recommended to manage diabetes.  Take any prescribed medicines to control diabetes as directed by your health care provider.  Control your hypertension.  Making food choices that are low in salt (sodium), saturated fat, trans fat, and cholesterol is recommended to manage hypertension.  Ask your health care provider if you need treatment to lower your blood pressure. Take any prescribed medicines to control hypertension as directed by your health care provider.  If you are 18-39 years of age, have your blood pressure checked every 3-5 years. If you are 40 years of age or older, have your blood pressure checked every year.  Maintain a healthy weight.  Reducing calorie intake and making food choices that are low in sodium, saturated fat, trans fat, and cholesterol are recommended to manage  weight.  Stop drug abuse.  Avoid taking birth control pills.  Talk to your health care provider about the risks of taking birth control pills if you are over 35 years old, smoke, get migraines, or have ever had a blood clot.  Get evaluated for sleep disorders (sleep apnea).  Talk to your health care provider about getting a sleep evaluation if you snore a lot or have excessive sleepiness.  Take medicines only as directed by your health care provider.  For some people, aspirin or blood thinners (anticoagulants) are helpful in reducing the risk of forming abnormal blood clots that can lead to stroke. If you have the irregular heart rhythm of atrial fibrillation, you should be on a blood thinner unless there is a good reason you cannot take them.  Understand all your medicine instructions.  Make sure that other conditions (such as anemia or atherosclerosis) are addressed. SEEK IMMEDIATE MEDICAL CARE IF:   You have sudden weakness or numbness of the face, arm, or leg, especially on one side of the body.  Your face or eyelid droops to one side.  You have sudden confusion.  You have trouble speaking (aphasia) or understanding.  You have sudden trouble seeing in one or both eyes.  You have sudden trouble walking.  You have dizziness.  You have a loss of balance or coordination.  You have a sudden, severe headache with no known cause.  You have new chest pain or an irregular heartbeat. Any of these symptoms may represent a serious problem that is an emergency. Do not wait to see if the symptoms will   go away. Get medical help at once. Call your local emergency services (911 in [place] Do not drive yourself to the hospital.   This information is not intended to replace advice given to you by your health care provider. Make sure you discuss any questions you have with your health care provider.   Document Released: 08/16/2004 Document Revised: 07/30/2014 Document Reviewed:  01/09/2013 Elsevier Interactive Patient Education 2016 Elsevier Inc.  

## 2016-04-27 NOTE — Addendum Note (Signed)
Addended by: Kaleen Mask on: 04/27/2016 04:58 PM   Modules accepted: Orders

## 2016-05-18 ENCOUNTER — Encounter: Payer: Self-pay | Admitting: Family Medicine

## 2016-05-18 ENCOUNTER — Ambulatory Visit (INDEPENDENT_AMBULATORY_CARE_PROVIDER_SITE_OTHER): Payer: Medicare Other | Admitting: Family Medicine

## 2016-05-18 VITALS — BP 122/62 | HR 60 | Temp 97.7°F | Resp 16 | Ht 74.0 in | Wt 154.0 lb

## 2016-05-18 DIAGNOSIS — Z0000 Encounter for general adult medical examination without abnormal findings: Secondary | ICD-10-CM

## 2016-05-18 DIAGNOSIS — I2510 Atherosclerotic heart disease of native coronary artery without angina pectoris: Secondary | ICD-10-CM | POA: Diagnosis not present

## 2016-05-18 DIAGNOSIS — K224 Dyskinesia of esophagus: Secondary | ICD-10-CM

## 2016-05-18 DIAGNOSIS — I10 Essential (primary) hypertension: Secondary | ICD-10-CM

## 2016-05-18 LAB — CBC WITH DIFFERENTIAL/PLATELET
BASOS PCT: 0 %
Basophils Absolute: 0 cells/uL (ref 0–200)
EOS ABS: 120 {cells}/uL (ref 15–500)
Eosinophils Relative: 3 %
HEMATOCRIT: 38.2 % — AB (ref 38.5–50.0)
Hemoglobin: 13 g/dL (ref 13.0–17.0)
Lymphocytes Relative: 26 %
Lymphs Abs: 1040 cells/uL (ref 850–3900)
MCH: 31.5 pg (ref 27.0–33.0)
MCHC: 34 g/dL (ref 32.0–36.0)
MCV: 92.5 fL (ref 80.0–100.0)
MONO ABS: 360 {cells}/uL (ref 200–950)
MPV: 9.7 fL (ref 7.5–12.5)
Monocytes Relative: 9 %
NEUTROS ABS: 2480 {cells}/uL (ref 1500–7800)
Neutrophils Relative %: 62 %
PLATELETS: 160 10*3/uL (ref 140–400)
RBC: 4.13 MIL/uL — AB (ref 4.20–5.80)
RDW: 13.7 % (ref 11.0–15.0)
WBC: 4 10*3/uL (ref 3.8–10.8)

## 2016-05-18 NOTE — Progress Notes (Signed)
Subjective:    Patient ID: Albert Garner, male    DOB: 1940/09/01, 75 y.o.   MRN: [id number]  HPI Patient is a very pleasant 75 year old white male here today for complete physical exam. He has a history of right external carotid artery occlusion. Recently had carotid Dopplers which confirmed occlusion however the internal carotid arteries are 1-39% and stable. He is taking an aspirin. He does not smoke but he does chew tobacco. He has no desire to quit.  The patient's PSA was checked in January and was normal. PSA was actually excellent at 0.6 suggesting a prostate cancer is highly unlikely. His colonoscopy is not due again until 2019. He has had Pneumovax 23 in the past. He received Prevnar 89 and April of this year. His tetanus shot is less than 12 years old. He politely declines a flu shot. We did discuss the shingles vaccine. He denies any depression or falls. He has not had any further episodes of dysphasia or food impaction. Gastroenterologist recommended a proton pump inhibitor however the patient has been self-medicating with buttermilk and he has had no further symptoms Past Medical History:  Diagnosis Date  . Allergy    occasional seasonal allergies  . Carotid artery occlusion   . Hyperlipidemia   . Hypertension    Past Surgical History:  Procedure Laterality Date  . COLONOSCOPY     2003 w/santagade-no report in epic  . [place]   left hand 3 rd digit  . INGUINAL HERNIA REPAIR  1986  . MOUTH SURGERY     with sedation-bottom teeth pulled   Current Outpatient Prescriptions on File Prior to Visit  Medication Sig Dispense Refill  . aspirin 81 MG tablet Take 81 mg by mouth daily.    Marland Kitchen atorvastatin (LIPITOR) 40 MG tablet TAKE ONE TABLET BY MOUTH ONCE DAILY 90 tablet 0  . atorvastatin (LIPITOR) 40 MG tablet TAKE ONE TABLET BY MOUTH ONCE DAILY 90 tablet 1  . lisinopril (PRINIVIL,ZESTRIL) 10 MG tablet Take 1 tablet (10 mg total) by mouth daily. 90 tablet 3  .  traZODone (DESYREL) 50 MG tablet Take 2 tablets (100 mg total) by mouth at bedtime. 60 tablet 11  . pantoprazole (PROTONIX) 40 MG tablet Take 1 tablet (40 mg total) by mouth daily. (Patient not taking: Reported on 05/18/2016) 30 tablet 3   No current facility-administered medications on file prior to visit.    No Known Allergies Social History   Social History  . Marital status: Married    Spouse name: N/A  . Number of children: N/A  . Years of education: N/A   Occupational History  . Not on file.   Social History Main Topics  . Smoking status: Never Smoker  . Smokeless tobacco: Current User    Types: Chew  . Alcohol use No  . Drug use: No  . Sexual activity: Yes   Other Topics Concern  . Not on file   Social History Narrative  . No narrative on file   Family History  Problem Relation Age of Onset  . Heart disease Mother   . Colon cancer Neg Hx   . Rectal cancer Neg Hx   . Stomach cancer Neg Hx   . Lung cancer Brother       Review of Systems  All other systems reviewed and are negative.      Objective:   Physical Exam  Constitutional: He is oriented to person, place, and time. He appears well-developed and well-nourished.  No distress.  HENT:  Head: Normocephalic and atraumatic.  Right Ear: External ear normal.  Left Ear: External ear normal.  Nose: Nose normal.  Mouth/Throat: Oropharynx is clear and moist. No oropharyngeal exudate.  Eyes: Conjunctivae and EOM are normal. Pupils are equal, round, and reactive to light. Right eye exhibits no discharge. Left eye exhibits no discharge. No scleral icterus.  Neck: Normal range of motion. Neck supple. No JVD present. No tracheal deviation present. No thyromegaly present.  Cardiovascular: Normal rate, regular rhythm, normal heart sounds and intact distal pulses.  Exam reveals no gallop and no friction rub.   No murmur heard. Pulmonary/Chest: Effort normal and breath sounds normal. No stridor. No respiratory  distress. He has no wheezes. He has no rales. He exhibits no tenderness.  Abdominal: Soft. Bowel sounds are normal. He exhibits no distension and no mass. There is no tenderness. There is no rebound and no guarding.  Genitourinary: Rectum normal, prostate normal and penis normal.  Musculoskeletal: Normal range of motion. He exhibits no edema or tenderness.  Lymphadenopathy:    He has no cervical adenopathy.  Neurological: He is alert and oriented to person, place, and time. He has normal reflexes. No cranial nerve deficit. He exhibits normal muscle tone. Coordination normal.  Skin: Skin is warm. No rash noted. He is not diaphoretic. There is erythema. No pallor.  Psychiatric: He has a normal mood and affect. His behavior is normal. Judgment and thought content normal.  Vitals reviewed.  please see the description in the history of present illness of the 2 lesions on his left temple just anterior to his ear.        Assessment & Plan:  Benign essential HTN - Plan: CBC with Differential/Platelet, COMPLETE METABOLIC PANEL WITH GFR, Lipid panel General medical exam  Declines a flu shot. We discussed the shingles vaccine that he will check on the price first. Blood pressure today is well controlled. I will check a fasting lipid panel. His goal LDL cholesterol is less than 70 given his history of carotid artery disease. Colonoscopy and PSA are up-to-date. Regular anticipatory guidance is provided. We discussed quitting tobacco but he has no desires to quit at the present time

## 2016-05-19 LAB — LIPID PANEL
Cholesterol: 133 mg/dL (ref 125–200)
HDL: 48 mg/dL (ref >=40)
LDL CALC: 71 mg/dL (ref <130)
Total CHOL/HDL Ratio: 2.8 Ratio (ref <=5.0)
Triglycerides: 70 mg/dL (ref <150)
VLDL: 14 mg/dL (ref <30)

## 2016-05-19 LAB — COMPLETE METABOLIC PANEL WITH GFR
ALT: 12 U/L (ref 9–46)
AST: 18 U/L (ref 10–35)
Albumin: 4.1 g/dL (ref 3.6–5.1)
Alkaline Phosphatase: 70 U/L (ref 40–115)
BILIRUBIN TOTAL: 0.7 mg/dL (ref 0.2–1.2)
BUN: 14 mg/dL (ref 7–25)
CHLORIDE: 105 mmol/L (ref 98–110)
CO2: 28 mmol/L (ref 20–31)
Calcium: 9.2 mg/dL (ref 8.6–10.3)
Creat: 0.88 mg/dL (ref 0.70–1.18)
GFR, EST NON AFRICAN AMERICAN: 84 mL/min (ref >=60)
GFR, Est African American: >89 mL/min (ref >=60)
GLUCOSE: 87 mg/dL (ref 70–99)
Potassium: 4.4 mmol/L (ref 3.5–5.3)
SODIUM: 143 mmol/L (ref 135–146)
TOTAL PROTEIN: 6.4 g/dL (ref 6.1–8.1)

## 2016-06-11 ENCOUNTER — Other Ambulatory Visit: Payer: Self-pay | Admitting: Family Medicine

## 2016-09-06 ENCOUNTER — Other Ambulatory Visit: Payer: Self-pay | Admitting: Family Medicine

## 2016-11-06 DIAGNOSIS — H5211 Myopia, right eye: Secondary | ICD-10-CM | POA: Diagnosis not present

## 2016-11-09 IMAGING — CR DG CHEST 2V
2 series · 3 of 3 positions shown · non-contrast
Comparison: None.

CLINICAL DATA: Possible foreign body seen esophagus.

EXAM:
CHEST  2 VIEW

[chest pa]
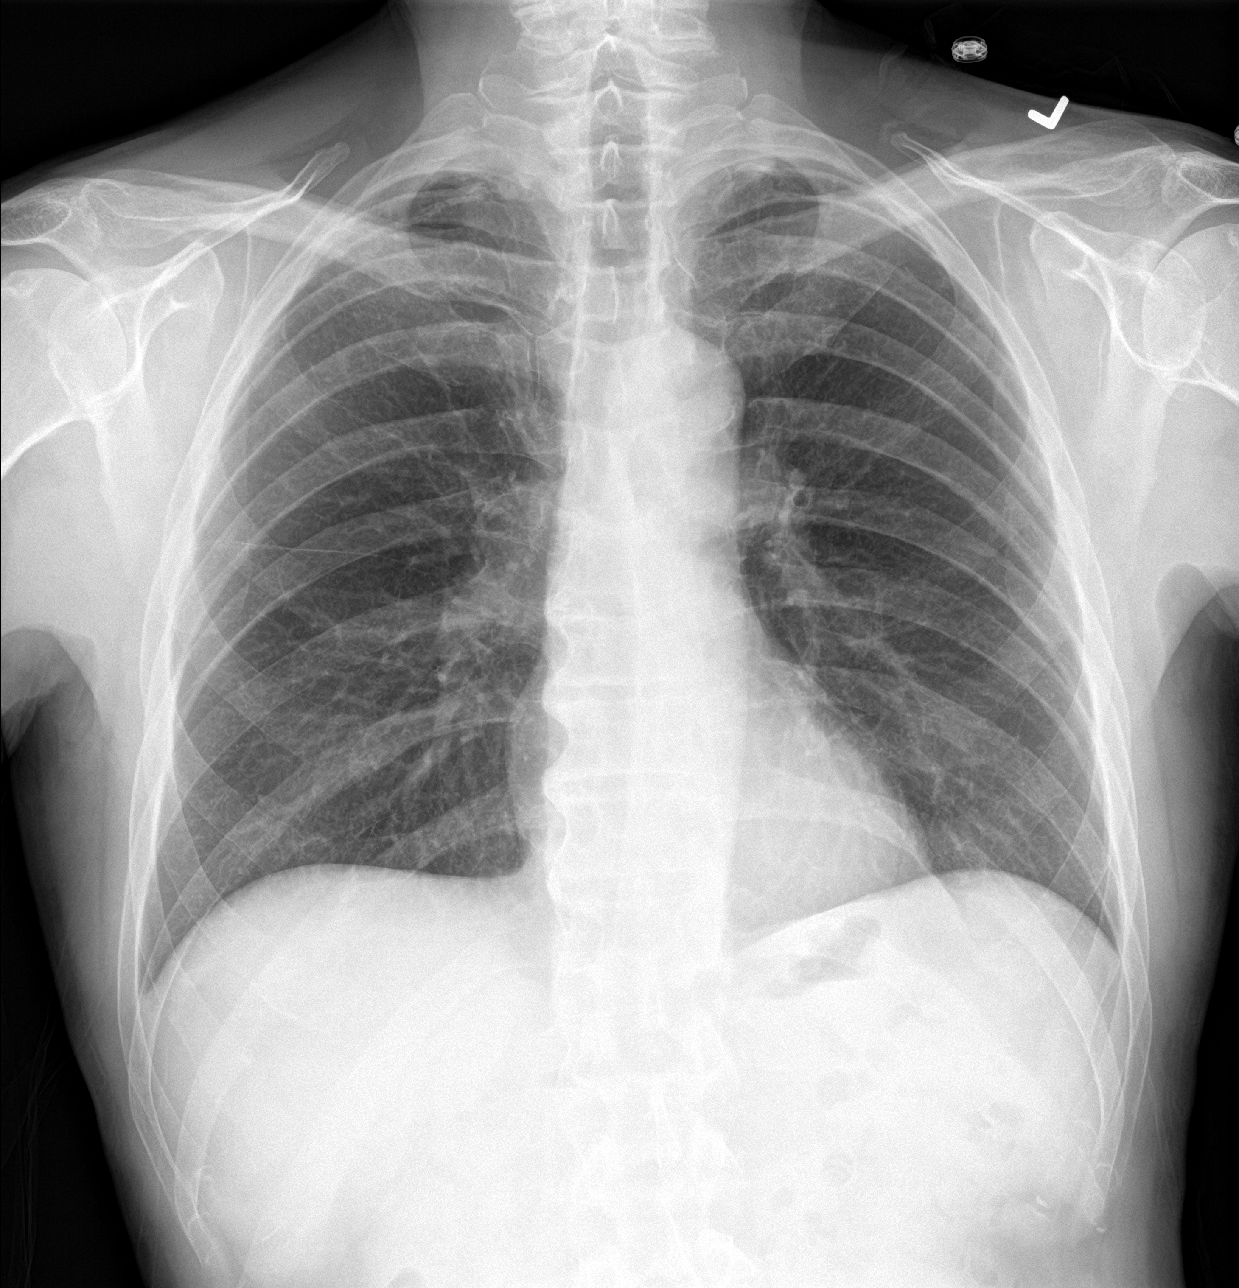

[Series 2: chest lat · 0.14mm/px · 2 of 2 slices shown]
[im 1/2]
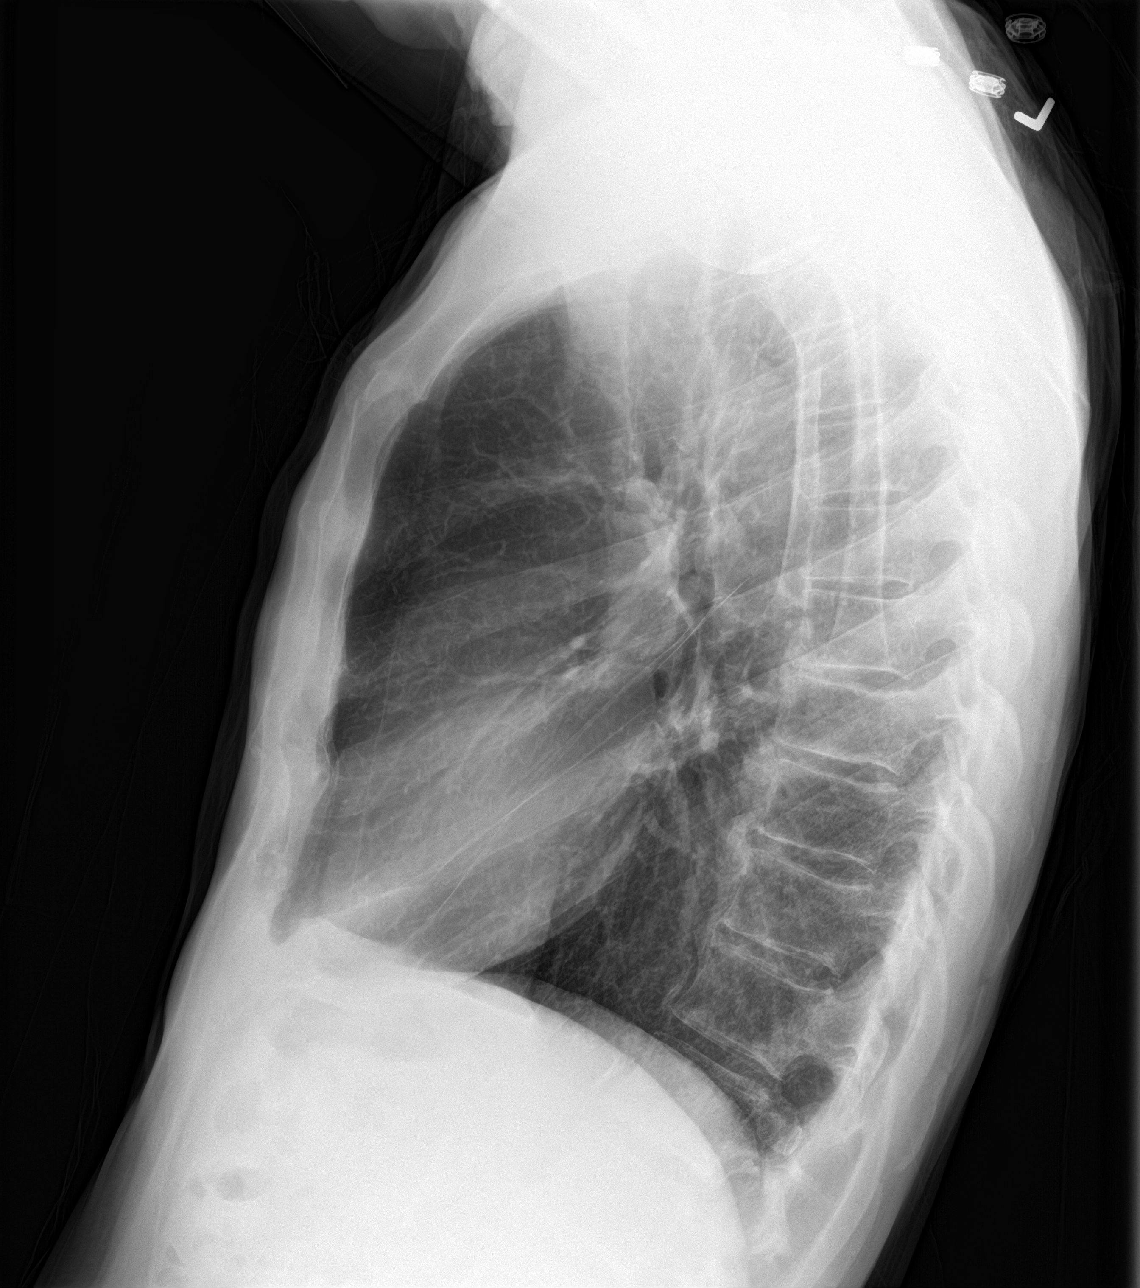
[im 2/2]
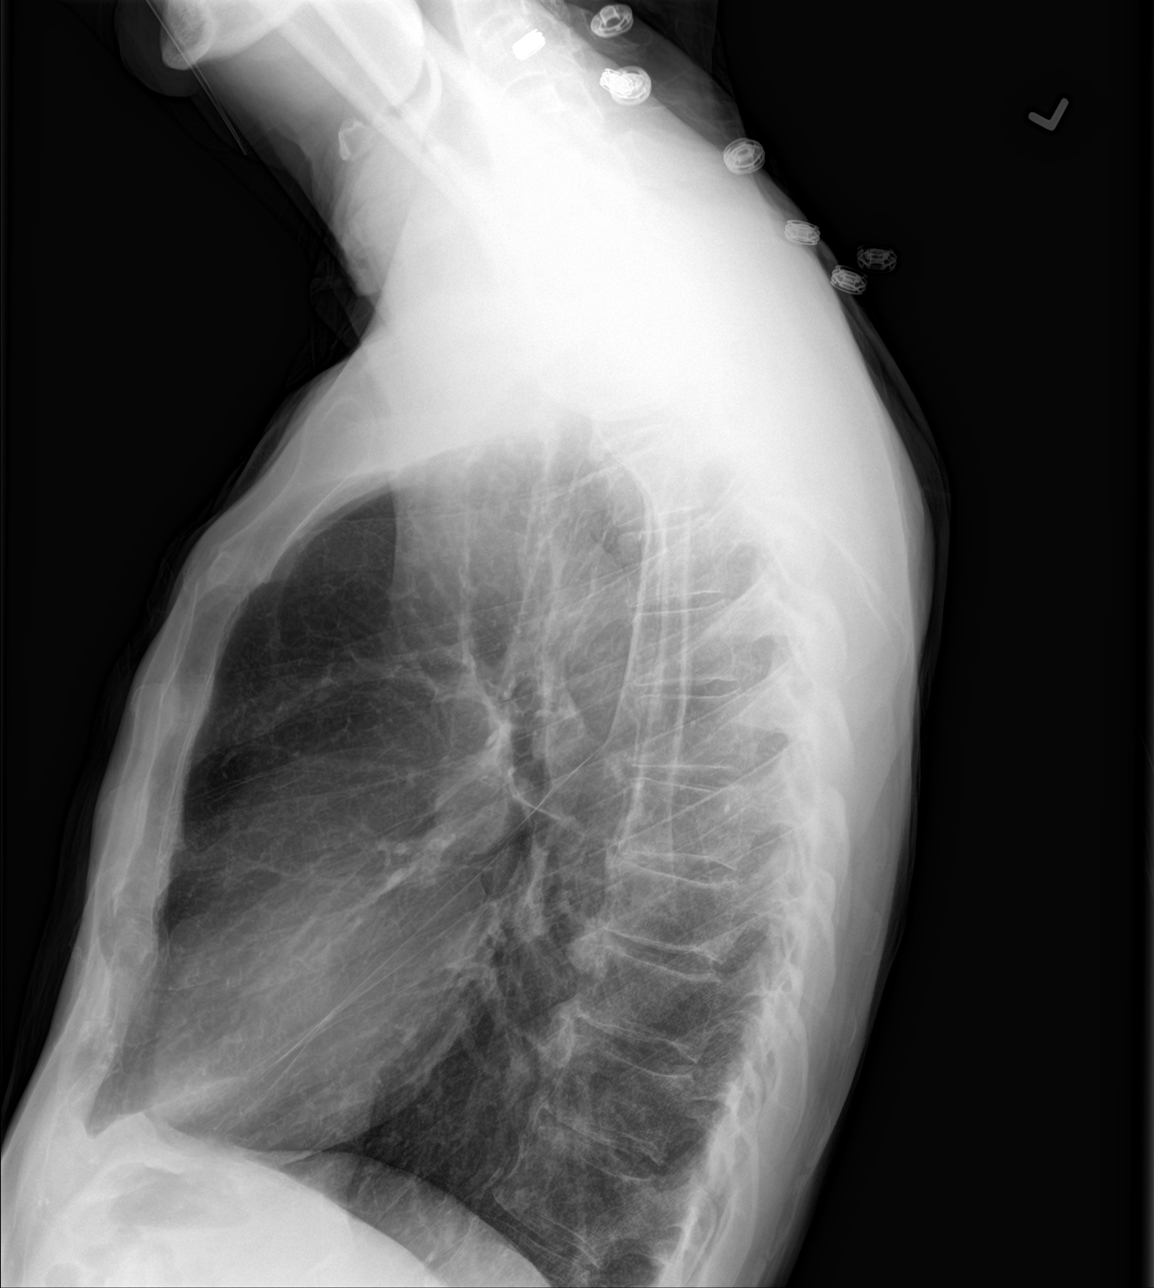

[3 of 3 positions shown; findings below may reference images not displayed]

FINDINGS: The heart size and mediastinal contours are within normal limits.
Both lungs are clear. The visualized skeletal structures are
unremarkable. No radiopaque foreign body seen.
IMPRESSION: No active cardiopulmonary disease.

## 2016-11-12 ENCOUNTER — Ambulatory Visit: Payer: Medicare Other | Admitting: Family Medicine

## 2017-02-05 ENCOUNTER — Other Ambulatory Visit: Payer: Self-pay | Admitting: Family Medicine

## 2017-02-05 DIAGNOSIS — I10 Essential (primary) hypertension: Secondary | ICD-10-CM

## 2017-03-09 ENCOUNTER — Encounter (HOSPITAL_COMMUNITY): Payer: Self-pay | Admitting: *Deleted

## 2017-03-09 ENCOUNTER — Emergency Department (HOSPITAL_COMMUNITY)
Admission: EM | Admit: 2017-03-09 | Discharge: 2017-03-09 | Disposition: A | Discharge status: Home / Self Care | Payer: Medicare Other | Attending: Emergency Medicine | Admitting: Emergency Medicine

## 2017-03-09 ENCOUNTER — Encounter (HOSPITAL_COMMUNITY)
Admission: EM | Disposition: A | Discharge status: Home / Self Care | Payer: Self-pay | Source: Home / Self Care | Attending: Emergency Medicine

## 2017-03-09 DIAGNOSIS — E785 Hyperlipidemia, unspecified: Secondary | ICD-10-CM | POA: Diagnosis not present

## 2017-03-09 DIAGNOSIS — Z79899 Other long term (current) drug therapy: Secondary | ICD-10-CM | POA: Diagnosis not present

## 2017-03-09 DIAGNOSIS — Z7982 Long term (current) use of aspirin: Secondary | ICD-10-CM | POA: Diagnosis not present

## 2017-03-09 DIAGNOSIS — K219 Gastro-esophageal reflux disease without esophagitis: Secondary | ICD-10-CM | POA: Insufficient documentation

## 2017-03-09 DIAGNOSIS — X58XXXA Exposure to other specified factors, initial encounter: Secondary | ICD-10-CM | POA: Insufficient documentation

## 2017-03-09 DIAGNOSIS — K222 Esophageal obstruction: Secondary | ICD-10-CM | POA: Diagnosis not present

## 2017-03-09 DIAGNOSIS — T18128A Food in esophagus causing other injury, initial encounter: Secondary | ICD-10-CM | POA: Diagnosis not present

## 2017-03-09 DIAGNOSIS — Z89022 Acquired absence of left finger(s): Secondary | ICD-10-CM | POA: Insufficient documentation

## 2017-03-09 DIAGNOSIS — T18120A Food in esophagus causing compression of trachea, initial encounter: Secondary | ICD-10-CM | POA: Diagnosis not present

## 2017-03-09 DIAGNOSIS — K449 Diaphragmatic hernia without obstruction or gangrene: Secondary | ICD-10-CM | POA: Diagnosis not present

## 2017-03-09 HISTORY — PX: FOREIGN BODY REMOVAL: SHX962

## 2017-03-09 HISTORY — PX: ESOPHAGOGASTRODUODENOSCOPY: SHX5428

## 2017-03-09 LAB — CBC WITH DIFFERENTIAL/PLATELET
BASOS ABS: 0 10*3/uL (ref 0.0–0.1)
Basophils Relative: 0 %
Eosinophils Absolute: 0.1 10*3/uL (ref 0.0–0.7)
Eosinophils Relative: 1 %
HEMATOCRIT: 39.9 % (ref 39.0–52.0)
HEMOGLOBIN: 13.4 g/dL (ref 13.0–17.0)
LYMPHS PCT: 17 %
Lymphs Abs: 1 10*3/uL (ref 0.7–4.0)
MCH: 31.2 pg (ref 26.0–34.0)
MCHC: 33.6 g/dL (ref 30.0–36.0)
MCV: 93 fL (ref 78.0–100.0)
MONO ABS: 0.2 10*3/uL (ref 0.1–1.0)
MONOS PCT: 4 %
NEUTROS ABS: 4.7 10*3/uL (ref 1.7–7.7)
NEUTROS PCT: 78 %
Platelets: 155 10*3/uL (ref 150–400)
RBC: 4.29 MIL/uL (ref 4.22–5.81)
RDW: 13.4 % (ref 11.5–15.5)
WBC: 6 10*3/uL (ref 4.0–10.5)

## 2017-03-09 LAB — BASIC METABOLIC PANEL
Anion gap: 10 (ref 5–15)
BUN: 23 mg/dL — ABNORMAL HIGH (ref 6–20)
CO2: 27 mmol/L (ref 22–32)
Calcium: 9.4 mg/dL (ref 8.9–10.3)
Chloride: 104 mmol/L (ref 101–111)
Creatinine, Ser: 1.04 mg/dL (ref 0.61–1.24)
GFR calc Af Amer: >60 mL/min (ref >60)
GFR calc non Af Amer: >60 mL/min (ref >60)
Glucose, Bld: 84 mg/dL (ref 65–99)
Potassium: 4.2 mmol/L (ref 3.5–5.1)
Sodium: 141 mmol/L (ref 135–145)

## 2017-03-09 SURGERY — EGD (ESOPHAGOGASTRODUODENOSCOPY)
Anesthesia: Moderate Sedation

## 2017-03-09 MED ORDER — MIDAZOLAM HCL 5 MG/5ML IJ SOLN
INTRAMUSCULAR | Status: AC
  Filled 2017-03-09: qty 10

## 2017-03-09 MED ORDER — GLUCAGON HCL RDNA (DIAGNOSTIC) 1 MG IJ SOLR
1.0000 mg | Freq: Once | INTRAMUSCULAR | Status: AC
  Administered 2017-03-09: 1 mg via INTRAVENOUS
  Filled 2017-03-09: qty 1

## 2017-03-09 MED ORDER — ONDANSETRON HCL 4 MG/2ML IJ SOLN
INTRAMUSCULAR | Status: DC | PRN
  Administered 2017-03-09: 4 mg via INTRAVENOUS

## 2017-03-09 MED ORDER — SODIUM CHLORIDE 0.9 % IV SOLN: INTRAVENOUS | Status: DC

## 2017-03-09 MED ORDER — SODIUM CHLORIDE 0.9 % IV SOLN
INTRAVENOUS | Status: AC | PRN
  Administered 2017-03-09: 1000 mL via INTRAVENOUS

## 2017-03-09 MED ORDER — MEPERIDINE HCL 100 MG/ML IJ SOLN
INTRAMUSCULAR | Status: AC
  Filled 2017-03-09: qty 2

## 2017-03-09 MED ORDER — LIDOCAINE VISCOUS 2 % MT SOLN
OROMUCOSAL | Status: AC
  Filled 2017-03-09: qty 15

## 2017-03-09 MED ORDER — MIDAZOLAM HCL 5 MG/5ML IJ SOLN
INTRAMUSCULAR | Status: DC | PRN
  Administered 2017-03-09 (×2): 1 mg via INTRAVENOUS

## 2017-03-09 MED ORDER — ONDANSETRON HCL 4 MG/2ML IJ SOLN
INTRAMUSCULAR | Status: AC
  Filled 2017-03-09: qty 2

## 2017-03-09 MED ORDER — MEPERIDINE HCL 100 MG/ML IJ SOLN
INTRAMUSCULAR | Status: DC | PRN
  Administered 2017-03-09 (×2): 25 mg via INTRAVENOUS

## 2017-03-09 MED ORDER — SODIUM CHLORIDE 0.9 % IV BOLUS (SEPSIS)
500.0000 mL | Freq: Once | INTRAVENOUS | Status: AC
  Administered 2017-03-09: 500 mL via INTRAVENOUS

## 2017-03-09 MED ORDER — BUTAMBEN-TETRACAINE-BENZOCAINE 2-2-14 % EX AERO
INHALATION_SPRAY | CUTANEOUS | Status: AC
  Filled 2017-03-09: qty 5

## 2017-03-09 MED ORDER — BUTAMBEN-TETRACAINE-BENZOCAINE 2-2-14 % EX AERO
INHALATION_SPRAY | CUTANEOUS | Status: DC | PRN
  Administered 2017-03-09: 1 via TOPICAL

## 2017-03-09 MED ORDER — SIMETHICONE 40 MG/0.6ML PO SUSP
ORAL | Status: AC
  Filled 2017-03-09: qty 30

## 2017-03-09 MED ORDER — STERILE WATER FOR IRRIGATION IR SOLN
Status: DC | PRN
  Administered 2017-03-09: 13:00:00

## 2017-03-09 NOTE — Discharge Instructions (Signed)
EGD Discharge instructions Please read the instructions outlined below and refer to this sheet in the next few weeks. These discharge instructions provide you with general information on caring for yourself after you leave the hospital. Your doctor may also give you specific instructions. While your treatment has been planned according to the most current medical practices available, unavoidable complications occasionally occur. If you have any problems or questions after discharge, please call your doctor. ACTIVITY  You may resume your regular activity but move at a slower pace for the next 24 hours.   Take frequent rest periods for the next 24 hours.   Walking will help expel (get rid of) the air and reduce the bloated feeling in your abdomen.   No driving for 24 hours (because of the anesthesia (medicine) used during the test).   You may shower.   Do not sign any important legal documents or operate any machinery for 24 hours (because of the anesthesia used during the test).  NUTRITION  Drink plenty of fluids.   You may resume your normal diet.   Begin with a light meal and progress to your normal diet.   Avoid alcoholic beverages for 24 hours or as instructed by your caregiver.  MEDICATIONS  You may resume your normal medications unless your caregiver tells you otherwise.  WHAT YOU CAN EXPECT TODAY  You may experience abdominal discomfort such as a feeling of fullness or gas pains.  FOLLOW-UP  Your doctor will discuss the results of your test with you.  SEEK IMMEDIATE MEDICAL ATTENTION IF ANY OF THE FOLLOWING OCCUR:  Excessive nausea (feeling sick to your stomach) and/or vomiting.   Severe abdominal pain and distention (swelling).   Trouble swallowing.   Temperature over 101 F (37.8 C).   Rectal bleeding or vomiting of blood.    GERD information provided  Begin Protonix 40 mg daily  Avoid hard candies; stay on a soft diet (chop meats etc.)  Return in 3-4  weeks to gauge her esophagus stretched  Office visit with [place] in 3-4 weeks  See Dentist to make sure dentures are fitting well. Take 30 minutes to eat a meal; chew food thoroughly. Have liquids on hand to help with swallowing.  Gastroesophageal Reflux Disease, Adult Normally, food travels down the esophagus and stays in the stomach to be digested. However, when a person has gastroesophageal reflux disease (GERD), food and stomach acid move back up into the esophagus. When this happens, the esophagus becomes sore and inflamed. Over time, GERD can create small holes (ulcers) in the lining of the esophagus. What are the causes? This condition is caused by a problem with the muscle between the esophagus and the stomach (lower esophageal sphincter, or LES). Normally, the LES muscle closes after food passes through the esophagus to the stomach. When the LES is weakened or abnormal, it does not close properly, and that allows food and stomach acid to go back up into the esophagus. The LES can be weakened by certain dietary substances, medicines, and medical conditions, including:  Tobacco use.  Pregnancy.  Having a hiatal hernia.  Heavy alcohol use.  Certain foods and beverages, such as coffee, chocolate, onions, and peppermint.  What increases the risk? This condition is more likely to develop in:  People who have an increased body weight.  People who have connective tissue disorders.  People who use NSAID medicines.  What are the signs or symptoms? Symptoms of this condition include:  Heartburn.  Difficult or painful swallowing.  The feeling of having a lump in the throat.  Abitter taste in the mouth.  Bad breath.  Having a large amount of saliva.  Having an upset or bloated stomach.  Belching.  Chest pain.  Shortness of breath or wheezing.  Ongoing (chronic) cough or a night-time cough.  Wearing away of tooth enamel.  Weight loss.  Different conditions can cause  chest pain. Make sure to see your health care provider if you experience chest pain. How is this diagnosed? Your health care provider will take a medical history and perform a physical exam. To determine if you have mild or severe GERD, your health care provider may also monitor how you respond to treatment. You may also have other tests, including:  An endoscopy toexamine your stomach and esophagus with a small camera.  A test thatmeasures the acidity level in your esophagus.  A test thatmeasures how much pressure is on your esophagus.  A barium swallow or modified barium swallow to show the shape, size, and functioning of your esophagus.  How is this treated? The goal of treatment is to help relieve your symptoms and to prevent complications. Treatment for this condition may vary depending on how severe your symptoms are. Your health care provider may recommend:  Changes to your diet.  Medicine.  Surgery.  Follow these instructions at home: Diet  Follow a diet as recommended by your health care provider. This may involve avoiding foods and drinks such as: ? Coffee and tea (with or without caffeine). ? Drinks that containalcohol. ? Energy drinks and sports drinks. ? Carbonated drinks or sodas. ? Chocolate and cocoa. ? Peppermint and mint flavorings. ? Garlic and onions. ? Horseradish. ? Spicy and acidic foods, including peppers, chili powder, curry powder, vinegar, hot sauces, and barbecue sauce. ? Citrus fruit juices and citrus fruits, such as oranges, lemons, and limes. ? Tomato-based foods, such as red sauce, chili, salsa, and pizza with red sauce. ? Fried and fatty foods, such as donuts, french fries, potato chips, and high-fat dressings. ? High-fat meats, such as hot dogs and fatty cuts of red and white meats, such as rib eye steak, sausage, ham, and bacon. ? High-fat dairy items, such as whole milk, butter, and cream cheese.  Eat small, frequent meals instead of  large meals.  Avoid drinking large amounts of liquid with your meals.  Avoid eating meals during the 2-3 hours before bedtime.  Avoid lying down right after you eat.  Do not exercise right after you eat. General instructions  Pay attention to any changes in your symptoms.  Take over-the-counter and prescription medicines only as told by your health care provider. Do not take aspirin, ibuprofen, or other NSAIDs unless your health care provider told you to do so.  Do not use any tobacco products, including cigarettes, chewing tobacco, and e-cigarettes. If you need help quitting, ask your health care provider.  Wear loose-fitting clothing. Do not wear anything tight around your waist that causes pressure on your abdomen.  Raise (elevate) the head of your bed 6 inches (15cm).  Try to reduce your stress, such as with yoga or meditation. If you need help reducing stress, ask your health care provider.  If you are overweight, reduce your weight to an amount that is healthy for you. Ask your health care provider for guidance about a safe weight loss goal.  Keep all follow-up visits as told by your health care provider. This is important. Contact a health care provider if:  You have new symptoms.  You have unexplained weight loss.  You have difficulty swallowing, or it hurts to swallow.  You have wheezing or a persistent cough.  Your symptoms do not improve with treatment.  You have a hoarse voice. Get help right away if:  You have pain in your arms, neck, jaw, teeth, or back.  You feel sweaty, dizzy, or light-headed.  You have chest pain or shortness of breath.  You vomit and your vomit looks like blood or coffee grounds.  You faint.  Your stool is bloody or black.  You cannot swallow, drink, or eat. This information is not intended to replace advice given to you by your health care provider. Make sure you discuss any questions you have with your health care  provider. Document Released: 04/18/2005 Document Revised: 12/07/2015 Document Reviewed: 11/03/2014 Elsevier Interactive Patient Education  2017 [employer].

## 2017-03-09 NOTE — H&P (Signed)
@LOGO @   Primary Care Physician:  [name], MD Primary Gastroenterologist:  Dr. Gala Romney  Pre-Procedure History & Physical: HPI:  Albert Garner is a 76 y.o. male here for urgently from the ED for probable food impaction. Has chronic intermittent esophageal dysphagia to solids. Swallowing some "coconut candy" yesterday. Felt a sting on his breastbone has not been able to swallow even saliva since that time. Glucagon did not help with the EGD. Dr. Lacinda Axon called me. Patient gives a long history of reflux and intermittent esophageal dysphagia. Reports social dilation about 30 years ago. Has been seen by our GI. Barium esophagram failed to show luminal obstruction. Has not had a recent EGD or ED. He does have partial dentures.  Past Medical History:  Diagnosis Date  . Allergy    occasional seasonal allergies  . Carotid artery occlusion   . Hyperlipidemia   . Hypertension     Past Surgical History:  Procedure Laterality Date  . COLONOSCOPY     2003 w/santagade-no report in epic  . [place]   left hand 3 rd digit  . INGUINAL HERNIA REPAIR  1986  . MOUTH SURGERY     with sedation-bottom teeth pulled    Prior to Admission medications   Medication Sig Start Date End Date Taking? Authorizing Provider  aspirin 81 MG tablet Take 81 mg by mouth daily.   Yes [provider]  atorvastatin (LIPITOR) 40 MG tablet TAKE ONE TABLET BY MOUTH ONCE DAILY 12/26/15  Yes [name], MD  lisinopril (PRINIVIL,ZESTRIL) 10 MG tablet TAKE ONE TABLET BY MOUTH ONCE DAILY 02/05/17  Yes [name], MD  traZODone (DESYREL) 50 MG tablet TAKE TWO TABLETS BY MOUTH AT BEDTIME 06/11/16  Yes [name], MD  pantoprazole (PROTONIX) 40 MG tablet Take 1 tablet (40 mg total) by mouth daily. Patient not taking: Reported on 05/18/2016 11/14/15   Alonza Bogus D, PA-C    Allergies as of 03/09/2017  . (No Known Allergies)    Family History  Problem Relation Age of Onset  .  Heart disease Mother   . Lung cancer Brother   . Colon cancer Neg Hx   . Rectal cancer Neg Hx   . Stomach cancer Neg Hx     Social History   Social History  . Marital status: Married    Spouse name: N/A  . Number of children: N/A  . Years of education: N/A   Occupational History  . Not on file.   Social History Main Topics  . Smoking status: Never Smoker  . Smokeless tobacco: Current User    Types: Chew  . Alcohol use No  . Drug use: No  . Sexual activity: Yes   Other Topics Concern  . Not on file   Social History Narrative  . No narrative on file    Review of Systems: See HPI, otherwise negative ROS  Physical Exam: BP (!) 138/58 (BP Location: Right Arm)   Pulse 61   Temp 98 F (36.7 C) (Oral)   Resp 16   Ht 6\' 1"  (1.854 m)   Wt 155 lb (70.3 kg)   SpO2 98%   BMI 20.45 kg/m  General:   Alert,  Well-developed, well-nourished, pleasant and cooperative in NAD Neck:  Supple; no masses or thyromegaly. No significant cervical adenopathy. Lungs:  Clear throughout to auscultation.   No wheezes, crackles, or rhonchi. No acute distress. Heart:  Regular rate and rhythm; no murmurs, clicks, rubs,  or  gallops. Abdomen: Non-distended, normal bowel sounds.  Soft and nontender without appreciable mass or hepatosplenomegaly.  Pulses:  Normal pulses noted. Extremities:  Without clubbing or edema.  Impression:   Very pleasant 76 year old gentleman with chronic esophageal dysphagia presents acutely with the signs and symptoms consistent with esophageal food impaction. Has chronic esophageal dysphagia and GERD-on no acid suppression therapy this time. No obvious obstruction seen on prior BPE   Recommendations: I have offered the patient an urgent EGD with disimpaction. He understands if the upper GI tract is not empty and/or their significant inflammation at the GE junction no dilation would be attempted today.  The risks, benefits, limitations, alternatives and imponderables  have been reviewed with the patient. Potential for esophageal dilation, biopsy, etc. have also been reviewed.  Questions have been answered. All parties agreeable.    Notice: This dictation was prepared with Dragon dictation along with smaller phrase technology. Any transcriptional errors that result from this process are unintentional and may not be corrected upon review.

## 2017-03-09 NOTE — ED Notes (Signed)
Pt Transported to Endo by Rosalyn Gess, RN

## 2017-03-09 NOTE — ED Notes (Signed)
ED Provider at bedside. 

## 2017-03-09 NOTE — ED Triage Notes (Addendum)
Pt c/o food stuck in his throat since about 1400 yesterday. Last ate a coconut bar. Pt reports he is unable to swallow his saliva and has to spit it out. Pt has hx of esophageal dilatation approximately 30 years ago.

## 2017-03-09 NOTE — ED Provider Notes (Signed)
[facility] DEPT Provider Note   CSN: [id number] Arrival date & time: 03/09/17  0747     History   Chief Complaint Chief Complaint  Patient presents with  . Food Impaction    HPI Albert Garner is a 76 y.o. male with a distant history of food impaction requiring esophageal dilatation presenting with lower midsternal pain and dysphagia after swallowing a piece of taffy like candy (coconut bar) around 4 pm yesterday.  He has been unable to swallow anything including his saliva since the event.  He denies fevers, chills, sob, n/v or abdominal pain, except a little "sore" in his stomach area which he blames on dry heaving overnight.  He was seen at [facility] last year with similar sx after swallowing chicken which self resolved and a barium swallow performed at the time revealed no obstruction but documented some distal esophageal dysmotility.  He has found no alleviators for todays symptoms.  The history is provided by the patient.    Past Medical History:  Diagnosis Date  . Allergy    occasional seasonal allergies  . Carotid artery occlusion   . Hyperlipidemia   . Hypertension     Patient Active Problem List   Diagnosis Date Noted  . Esophageal dysmotility 11/14/2015  . Dysphagia 11/14/2015  . Occlusion and stenosis of carotid artery without mention of cerebral infarction 03/18/2012    Past Surgical History:  Procedure Laterality Date  . COLONOSCOPY     2003 w/santagade-no report in epic  . [place]   left hand 3 rd digit  . INGUINAL HERNIA REPAIR  1986  . MOUTH SURGERY     with sedation-bottom teeth pulled       Home Medications    Prior to Admission medications   Medication Sig Start Date End Date Taking? Authorizing Provider  aspirin 81 MG tablet Take 81 mg by mouth daily.   Yes [provider]  atorvastatin (LIPITOR) 40 MG tablet TAKE ONE TABLET BY MOUTH ONCE DAILY 12/26/15  Yes [name], MD  lisinopril (PRINIVIL,ZESTRIL) 10 MG  tablet TAKE ONE TABLET BY MOUTH ONCE DAILY 02/05/17  Yes [name], MD  traZODone (DESYREL) 50 MG tablet TAKE TWO TABLETS BY MOUTH AT BEDTIME 06/11/16  Yes [name], MD  pantoprazole (PROTONIX) 40 MG tablet Take 1 tablet (40 mg total) by mouth daily. Patient not taking: Reported on 05/18/2016 11/14/15   Zehr, Laban Emperor, PA-C    Family History Family History  Problem Relation Age of Onset  . Heart disease Mother   . Lung cancer Brother   . Colon cancer Neg Hx   . Rectal cancer Neg Hx   . Stomach cancer Neg Hx     Social History Social History  Substance Use Topics  . Smoking status: Never Smoker  . Smokeless tobacco: Current User    Types: Chew  . Alcohol use No     Allergies   Patient has no known allergies.   Review of Systems Review of Systems  Constitutional: Negative for fever.  HENT: Negative for congestion and sore throat.   Eyes: Negative.   Respiratory: Negative for chest tightness and shortness of breath.   Cardiovascular: Positive for chest pain. Negative for palpitations.  Gastrointestinal: Positive for abdominal pain. Negative for nausea and vomiting.       Negative except as mentioned in HPI.   Genitourinary: Negative.   Musculoskeletal: Negative for arthralgias, joint swelling and neck pain.  Skin: Negative.  Negative for  rash and wound.  Neurological: Negative for dizziness, weakness, light-headedness, numbness and headaches.  Psychiatric/Behavioral: Negative.      Physical Exam Updated Vital Signs BP (!) 145/62   Pulse 64   Temp 98.2 F (36.8 C) (Oral)   Resp 18   Ht 6\' 1"  (1.854 m)   Wt 70.3 kg (155 lb)   SpO2 99%   BMI 20.45 kg/m   Physical Exam  Constitutional: He appears well-developed and well-nourished. No distress.  HENT:  Head: Normocephalic and atraumatic.  Eyes: Conjunctivae are normal.  Neck: Normal range of motion.  Cardiovascular: Normal rate, regular rhythm, normal heart sounds and intact distal pulses.     Pulmonary/Chest: Effort normal and breath sounds normal. He has no wheezes.  Abdominal: Soft. Bowel sounds are normal. He exhibits no mass. There is tenderness in the epigastric area. There is no guarding.  Mild discomfort to palpation in epigastric region, no guarding.  Pt frequently spitting saliva.  Musculoskeletal: Normal range of motion.  Neurological: He is alert.  Skin: Skin is warm and dry.  Psychiatric: He has a normal mood and affect.  Nursing note and vitals reviewed.    ED Treatments / Results  Labs (all labs ordered are listed, but only abnormal results are displayed) Labs Reviewed  BASIC METABOLIC PANEL - Abnormal; Notable for the following:       Result Value   BUN 23 (*)    All other components within normal limits  CBC WITH DIFFERENTIAL/PLATELET    EKG  EKG Interpretation None       Radiology No results found.  Procedures Procedures (including critical care time)  Medications Ordered in ED Medications  midazolam (VERSED) 5 MG/5ML injection (not administered)  ondansetron (ZOFRAN) 4 MG/2ML injection (not administered)  meperidine (DEMEROL) 100 MG/ML injection (not administered)  lidocaine (XYLOCAINE) 2 % viscous mouth solution (not administered)  simethicone (MYLICON) 40 BJ/4.7WG suspension (not administered)  glucagon (human recombinant) (GLUCAGEN) injection 1 mg (1 mg Intravenous Given 03/09/17 0857)  sodium chloride 0.9 % bolus 500 mL (0 mLs Intravenous Stopped 03/09/17 1051)  glucagon (human recombinant) (GLUCAGEN) injection 1 mg (1 mg Intravenous Given 03/09/17 0938)     Initial Impression / Assessment and Plan / ED Course  I have reviewed the triage vital signs and the nursing notes.  Pertinent labs & imaging results that were available during my care of the patient were reviewed by me and considered in my medical decision making (see chart for details).     Patient was given 2 doses of glucagon per IV with no improvement in ability to  swallow and maintain by mouth fluids.  Discussion by Dr. Lacinda Axon with Dr. Gala Romney with plan for endoscopy.  Patient was notified of plan.  Final Clinical Impressions(s) / ED Diagnoses   Final diagnoses:  Food impaction of esophagus, initial encounter    New Prescriptions New Prescriptions   No medications on file     Landis Martins 03/09/17 1154    Nat Christen, MD 03/10/17 [phone]

## 2017-03-09 NOTE — ED Notes (Signed)
Swallow evaluation complete 15 minutes after giving Glucagon IV. Pt swallowed Ginger ale without coughing. However, Pt states that he feels the cookie mid sternal. Listened to breath sounds. Clear lung sounds bilaterally. Notified Evalee Jefferson PA

## 2017-03-09 NOTE — Op Note (Signed)
[facility] Patient Name: Albert Garner Procedure Date: 03/09/2017 12:05 PM MRN: [id number] Date of Birth: 1941/04/29 Attending MD: Norvel Richards , MD CSN: [id number] Age: 76 Admit Type: Ambulatory Procedure:                Upper GI endoscopy Indications:              Foreign body in the esophagus Providers:                Norvel Richards, MD, Lurline Del, RN, Hinton Rao, RN Referring MD:             Cammie Mcgee. [place] Medicines:                Midazolam 2 mg IV, Meperidine 50 mg IV Complications:            No immediate complications. Estimated Blood Loss:     Estimated blood loss was minimal. Procedure:                Pre-Anesthesia Assessment:                           - Prior to the procedure, a History and Physical                            was performed, and patient medications and                            allergies were reviewed. The patient's tolerance of                            previous anesthesia was also reviewed. The risks                            and benefits of the procedure and the sedation                            options and risks were discussed with the patient.                            All questions were answered, and informed consent                            was obtained. Prior Anticoagulants: The patient has                            taken no previous anticoagulant or antiplatelet                            agents. ASA Grade Assessment: III - A patient with                            severe systemic disease. After reviewing the risks  and benefits, the patient was deemed in                            satisfactory condition to undergo the procedure.                           After obtaining informed consent, the endoscope was                            passed under direct vision. Throughout the                            procedure, the patient's blood pressure, pulse, and                      oxygen saturations were monitored continuously. The                            [id number] ([id number]) scope was introduced through the                            mouth, and advanced to the gastric cardia. The                            upper GI endoscopy was accomplished without                            difficulty. The patient tolerated the procedure                            well. Scope In: 12:35:48 PM Scope Out: 12:42:33 PM Total Procedure Duration: 0 hours 6 minutes 45 seconds  Findings:      Bolus of food material somewhat impacted in the distal esophagus. Gentle       pressure applied against the center of this bolus with the tip of the       scope resulted in tunneling into the bolus, pushing the central part       into the stomach. With judicious washing and additional pressure, I was       able to push the remainder of the bolus into the stomach. The distal       esophagus/ GE junction quite macerated. I did identify Schatzki's ring       along with a moderate size hiatal hernia. Patient had quite a bit of       food in the hernia sac. I did not attempt to complete the exam or dilate       the esophagus. Impression:               - Esophageal food impaction - removed. Schatzki's                            ring. Hiatal hernia. Incomplete examination - see                            above Moderate Sedation:      Moderate (conscious) sedation was administered by the endoscopy  nurse       and supervised by the endoscopist. The following parameters were       monitored: oxygen saturation, heart rate, blood pressure, respiratory       rate, EKG, adequacy of pulmonary ventilation, and response to care.       Total physician intraservice time was 13 minutes. Recommendation:           - Patient has a contact number available for                            emergencies. The signs and symptoms of potential                            delayed complications were discussed  with the                            patient. Return to normal activities tomorrow.                            Written discharge instructions were provided to the                            patient.                           - Resume Protonix 40 mg daily                           - Continue present medications. Chopped meats.                            Swallowing precautions reviewed.                           - Repeat upper endoscopy in 4 weeks for                            retreatment. See dentist to optimize proper fitting                            of dental appliance.                           - Return to GI clinic in 4 weeks. Patient can                            follow-up with Dr. Ardis Hughs or would be happy to see                            him up here. Procedure Code(s):        --- Professional ---                           44315, Esophagoscopy, flexible, transoral;  diagnostic, including collection of specimen(s) by                            brushing or washing, when performed (separate                            procedure)                           99152, Moderate sedation services provided by the                            same physician or other qualified health care                            professional performing the diagnostic or                            therapeutic service that the sedation supports,                            requiring the presence of an independent trained                            observer to assist in the monitoring of the                            patient's level of consciousness and physiological                            status; initial 15 minutes of intraservice time,                            patient age 44 years or older Diagnosis Code(s):        --- Professional ---                           T18.108A, Unspecified foreign body in esophagus                            causing other injury, initial encounter CPT  copyright 2016 American Medical Association. All rights reserved. The codes documented in this report are preliminary and upon coder review may  be revised to meet current compliance requirements. Cristopher Estimable. Susann Lawhorne, MD Norvel Richards, MD 03/09/2017 1:05:24 PM This report has been signed electronically. Number of Addenda: 0

## 2017-03-09 NOTE — ED Notes (Signed)
Pt getting undressed fully at this time with family at bedside.  Dr. Gala Romney reported that he will do procedure at 1215 today. Family and pt updated.

## 2017-03-11 ENCOUNTER — Telehealth: Payer: Self-pay | Admitting: Internal Medicine

## 2017-03-11 NOTE — Telephone Encounter (Signed)
[phone]  PATIENT DAUGHTER  CALLED AND STATED THAT RMR WANTED HER DAD TO SCHEDULE FOR A ENDO TO STRETCH HIM IN 3-4 WEEKS.Clarnce Flock HIM AT [facility].  PLEASE ADVISE.

## 2017-03-11 NOTE — Telephone Encounter (Signed)
Dr. Gala Romney, please advise. Does he need to be triaged for repeat EGD/Dil?

## 2017-03-15 ENCOUNTER — Other Ambulatory Visit: Payer: Self-pay | Admitting: Family Medicine

## 2017-03-18 ENCOUNTER — Encounter (HOSPITAL_COMMUNITY): Payer: Self-pay | Admitting: Internal Medicine

## 2017-03-27 NOTE — Telephone Encounter (Addendum)
Pt's daughter Lelon Frohlich) called office again to see if pt needs to be scheduled for EGD/Dil. She said it has been 3 weeks since last procedure. Advised her that message had been sent to RMR but I haven't heard back. Informed her I would send another message to RMR. She can be reached at [phone].  FYI-He was seen in ER 03/09/17 for food impaction and had EGD.

## 2017-03-27 NOTE — Telephone Encounter (Signed)
Communication noted.  

## 2017-03-27 NOTE — Telephone Encounter (Signed)
Reviewed

## 2017-03-27 NOTE — Telephone Encounter (Signed)
Spoke with Almyra Free and reviewed EGD note from 03/09/17. RMR wanted pt to follow-up with Dr. Ardis Hughs or he could come to our office in 4 weeks. Tried to call pt's daughter back to inform her, no answer, LMOVM for her to call office.

## 2017-03-27 NOTE — Telephone Encounter (Signed)
Pt's daughter called office. Informed her of RMR recommendation on procedure note. She said her dad wants to come to this office. He is still eating soft foods. OV with LSL scheduled for 04/04/17 at 11:30am.

## 2017-04-04 ENCOUNTER — Encounter: Payer: Self-pay | Admitting: Gastroenterology

## 2017-04-04 ENCOUNTER — Ambulatory Visit (INDEPENDENT_AMBULATORY_CARE_PROVIDER_SITE_OTHER): Payer: Medicare Other | Admitting: Gastroenterology

## 2017-04-04 ENCOUNTER — Other Ambulatory Visit: Payer: Self-pay

## 2017-04-04 VITALS — BP 137/65 | HR 66 | Temp 97.8°F | Ht 73.5 in | Wt 150.6 lb

## 2017-04-04 DIAGNOSIS — R1319 Other dysphagia: Secondary | ICD-10-CM

## 2017-04-04 DIAGNOSIS — R1310 Dysphagia, unspecified: Secondary | ICD-10-CM

## 2017-04-04 DIAGNOSIS — D126 Benign neoplasm of colon, unspecified: Secondary | ICD-10-CM | POA: Diagnosis not present

## 2017-04-04 NOTE — Progress Notes (Signed)
Primary Care Physician:  [name], MD  Primary Gastroenterologist:  Garfield Cornea, MD   Chief Complaint  Patient presents with  . Dysphagia    repeat EGD-food impaction    HPI:  Albert Garner is a 76 y.o. male here To schedule EGD with dilation. He was seen in the ED back on 03/09/2017 for foreign body removal. He had bolus of food material impacted in the distal esophagus. Food bolus was able to be pushed through. Distal esophagus/GE junction was quite macerated. He had a Schatzki ring along with moderate-sized hiatal hernia. On a bit of food in the hernia sac, exam could not be completed. Esophagus was not dilated.  Patient states he had coconut candy lodged in his esophagus for 30 hours during that episode. He's lost 5 pounds since August 18. He's been eating a "mushy" diet. Denies heartburn. No abdominal pain. Bowel movements are regular. No blood in the stool or melena. No odynophagia.   Current Outpatient Prescriptions  Medication Sig Dispense Refill  . aspirin 81 MG tablet Take 81 mg by mouth daily.    Marland Kitchen atorvastatin (LIPITOR) 40 MG tablet TAKE ONE TABLET BY MOUTH ONCE DAILY 90 tablet 0  . lisinopril (PRINIVIL,ZESTRIL) 10 MG tablet TAKE ONE TABLET BY MOUTH ONCE DAILY 90 tablet 3  . pantoprazole (PROTONIX) 40 MG tablet Take 40 mg by mouth daily.    . traZODone (DESYREL) 50 MG tablet TAKE TWO TABLETS BY MOUTH AT BEDTIME 60 tablet 11   No current facility-administered medications for this visit.     Allergies as of 04/04/2017  . (No Known Allergies)    Past Medical History:  Diagnosis Date  . Allergy    occasional seasonal allergies  . Carotid artery occlusion   . Hyperlipidemia   . Hypertension     Past Surgical History:  Procedure Laterality Date  . COLONOSCOPY     2003 w/santagade-no report in epic  . COLONOSCOPY  01/2015   Dr. Ardis Hughs: four polyps removed, 3 tubular adenomas. next tcs 3 years  . ESOPHAGOGASTRODUODENOSCOPY N/A 03/09/2017   Procedure:  ESOPHAGOGASTRODUODENOSCOPY (EGD);  Surgeon: Daneil Dolin, MD;  Location: [facility] ENDO SUITE;  Service: Endoscopy;  Laterality: N/A;  . [place]   left hand 3 rd digit  . FOREIGN BODY REMOVAL N/A 03/09/2017   Procedure: FOREIGN BODY REMOVAL;  Surgeon: Daneil Dolin, MD;  Location: [facility] ENDO SUITE;  Service: Endoscopy;  Laterality: N/A;  . INGUINAL HERNIA REPAIR Right 1986  . MOUTH SURGERY     with sedation-bottom teeth pulled    Family History  Problem Relation Age of Onset  . Heart disease Mother   . Throat cancer Brother   . Colon cancer Neg Hx   . Rectal cancer Neg Hx   . Stomach cancer Neg Hx     Social History   Social History  . Marital status: Married    Spouse name: N/A  . Number of children: N/A  . Years of education: N/A   Occupational History  . Not on file.   Social History Main Topics  . Smoking status: Never Smoker  . Smokeless tobacco: Current User    Types: Chew  . Alcohol use No  . Drug use: No  . Sexual activity: Yes   Other Topics Concern  . Not on file   Social History Narrative  . No narrative on file      ROS:  General: Negative for anorexia, weight loss, fever, chills, fatigue, weakness.  Eyes: Negative for vision changes.  ENT: Negative for hoarseness,nasal congestion. CV: Negative for chest pain, angina, palpitations, dyspnea on exertion, peripheral edema.  Respiratory: Negative for dyspnea at rest, dyspnea on exertion, cough, sputum, wheezing.  GI: See history of present illness. GU:  Negative for dysuria, hematuria, urinary incontinence, urinary frequency, nocturnal urination.  MS: Negative for joint pain, low back pain.  Derm: Negative for rash or itching.  Neuro: Negative for weakness, abnormal sensation, seizure, frequent headaches, memory loss, confusion.  Psych: Negative for anxiety, depression, suicidal ideation, hallucinations.  Endo: Negative for unusual weight change.  Heme: Negative for bruising or  bleeding. Allergy: Negative for rash or hives.    Physical Examination:  BP 137/65   Pulse 66   Temp 97.8 F (36.6 C) (Oral)   Ht 6' 1.5" (1.867 m)   Wt 150 lb 9.6 oz (68.3 kg)   BMI 19.60 kg/m    General: Well-nourished, well-developed in no acute distress.  Head: Normocephalic, atraumatic.   Eyes: Conjunctiva pink, no icterus. Mouth: Oropharyngeal mucosa moist and pink , no lesions erythema or exudate. Neck: Supple without thyromegaly, masses, or lymphadenopathy.  Lungs: Clear to auscultation bilaterally.  Heart: Regular rate and rhythm, no murmurs rubs or gallops.  Abdomen: Bowel sounds are normal, nontender, nondistended, no hepatosplenomegaly or masses, no abdominal bruits or    hernia , no rebound or guarding.   Rectal: Not performed Extremities: No lower extremity edema. No clubbing or deformities.  Neuro: Alert and oriented x 4 , grossly normal neurologically.  Skin: Warm and dry, no rash or jaundice.   Psych: Alert and cooperative, normal mood and affect.  Labs: Lab Results  Component Value Date   CREATININE 1.04 03/09/2017   BUN 23 (H) 03/09/2017   NA 141 03/09/2017   K 4.2 03/09/2017   CL 104 03/09/2017   CO2 27 03/09/2017   Lab Results  Component Value Date   ALT 12 05/18/2016   AST 18 05/18/2016   ALKPHOS 70 05/18/2016   BILITOT 0.7 05/18/2016   Lab Results  Component Value Date   WBC 6.0 03/09/2017   HGB 13.4 03/09/2017   HCT 39.9 03/09/2017   MCV 93.0 03/09/2017   PLT 155 03/09/2017     Imaging Studies: No results found.

## 2017-04-04 NOTE — Progress Notes (Signed)
CC'D TO PCP °

## 2017-04-04 NOTE — Assessment & Plan Note (Signed)
Plan for EGD with dilation in the near future.  I have discussed the risks, alternatives, benefits with regards to but not limited to the risk of reaction to medication, bleeding, infection, perforation and the patient is agreeable to proceed. Written consent to be obtained.  Encouraged adding nutritional supplement to his diet to maintain his weight in the interim.

## 2017-04-04 NOTE — Patient Instructions (Addendum)
1. Upper endoscopy with dilation of your esophagus as scheduled. Please see separate instructions. 2. Please drink at least 2 Boost or Ensure daily to help maintain your weight between now and your procedure.  3. Your next colonoscopy will be due July 2019.

## 2017-04-04 NOTE — Assessment & Plan Note (Signed)
Patient states he wants to continue with Dr. Gala Romney, due to proximity to his home. Dr. Ardis Hughs had planned on surveillance colonoscopy in July 2019, we will send a reminder to the patient at that time.

## 2017-04-11 ENCOUNTER — Encounter (HOSPITAL_COMMUNITY): Payer: Self-pay | Admitting: *Deleted

## 2017-04-11 ENCOUNTER — Ambulatory Visit (HOSPITAL_COMMUNITY)
Admission: RE | Admit: 2017-04-11 | Discharge: 2017-04-11 | Disposition: A | Discharge status: Home / Self Care | Payer: Medicare Other | Source: Ambulatory Visit | Attending: Internal Medicine | Admitting: Internal Medicine

## 2017-04-11 ENCOUNTER — Encounter (HOSPITAL_COMMUNITY)
Admission: RE | Disposition: A | Discharge status: Home / Self Care | Payer: Self-pay | Source: Ambulatory Visit | Attending: Internal Medicine

## 2017-04-11 DIAGNOSIS — K449 Diaphragmatic hernia without obstruction or gangrene: Secondary | ICD-10-CM | POA: Diagnosis not present

## 2017-04-11 DIAGNOSIS — Z7982 Long term (current) use of aspirin: Secondary | ICD-10-CM | POA: Insufficient documentation

## 2017-04-11 DIAGNOSIS — K222 Esophageal obstruction: Secondary | ICD-10-CM | POA: Diagnosis not present

## 2017-04-11 DIAGNOSIS — E785 Hyperlipidemia, unspecified: Secondary | ICD-10-CM | POA: Insufficient documentation

## 2017-04-11 DIAGNOSIS — Z79899 Other long term (current) drug therapy: Secondary | ICD-10-CM | POA: Diagnosis not present

## 2017-04-11 DIAGNOSIS — I10 Essential (primary) hypertension: Secondary | ICD-10-CM | POA: Insufficient documentation

## 2017-04-11 DIAGNOSIS — R1310 Dysphagia, unspecified: Secondary | ICD-10-CM | POA: Diagnosis not present

## 2017-04-11 HISTORY — PX: MALONEY DILATION: SHX5535

## 2017-04-11 HISTORY — PX: ESOPHAGOGASTRODUODENOSCOPY: SHX5428

## 2017-04-11 SURGERY — EGD (ESOPHAGOGASTRODUODENOSCOPY)
Anesthesia: Moderate Sedation

## 2017-04-11 MED ORDER — ONDANSETRON HCL 4 MG/2ML IJ SOLN
INTRAMUSCULAR | Status: AC
  Filled 2017-04-11: qty 2

## 2017-04-11 MED ORDER — ONDANSETRON HCL 4 MG/2ML IJ SOLN
INTRAMUSCULAR | Status: DC | PRN
  Administered 2017-04-11: 4 mg via INTRAVENOUS

## 2017-04-11 MED ORDER — STERILE WATER FOR IRRIGATION IR SOLN
Status: DC | PRN
  Administered 2017-04-11: 16:00:00

## 2017-04-11 MED ORDER — LIDOCAINE VISCOUS 2 % MT SOLN
OROMUCOSAL | Status: AC
  Filled 2017-04-11: qty 15

## 2017-04-11 MED ORDER — MEPERIDINE HCL 100 MG/ML IJ SOLN
INTRAMUSCULAR | Status: DC | PRN
  Administered 2017-04-11: 50 mg
  Administered 2017-04-11: 25 mg via INTRAVENOUS

## 2017-04-11 MED ORDER — LIDOCAINE VISCOUS 2 % MT SOLN
OROMUCOSAL | Status: DC | PRN
  Administered 2017-04-11: 1 via OROMUCOSAL

## 2017-04-11 MED ORDER — MEPERIDINE HCL 100 MG/ML IJ SOLN
INTRAMUSCULAR | Status: AC
  Filled 2017-04-11: qty 2

## 2017-04-11 MED ORDER — MIDAZOLAM HCL 5 MG/5ML IJ SOLN
INTRAMUSCULAR | Status: AC
  Filled 2017-04-11: qty 10

## 2017-04-11 MED ORDER — MIDAZOLAM HCL 5 MG/5ML IJ SOLN
INTRAMUSCULAR | Status: DC | PRN
  Administered 2017-04-11: 1 mg via INTRAVENOUS
  Administered 2017-04-11: 2 mg via INTRAVENOUS

## 2017-04-11 MED ORDER — SODIUM CHLORIDE 0.9 % IV SOLN
INTRAVENOUS | Status: DC
  Administered 2017-04-11: 14:00:00 via INTRAVENOUS

## 2017-04-11 NOTE — Interval H&P Note (Signed)
History and Physical Interval Note:  04/11/2017 3:25 PM  Albert Garner  has presented today for surgery, with the diagnosis of DYSPHAGIA  The various methods of treatment have been discussed with the patient and family. After consideration of risks, benefits and other options for treatment, the patient has consented to  Procedure(s) with comments: ESOPHAGOGASTRODUODENOSCOPY (EGD) (N/A) - [address] [place] (N/A) as a surgical intervention .  The patient's history has been reviewed, patient examined, no change in status, stable for surgery.  I have reviewed the patient's chart and labs.  Questions were answered to the patient's satisfaction.     [name]

## 2017-04-11 NOTE — Discharge Instructions (Addendum)
EGD Discharge instructions Please read the instructions outlined below and refer to this sheet in the next few weeks. These discharge instructions provide you with general information on caring for yourself after you leave the hospital. Your doctor may also give you specific instructions. While your treatment has been planned according to the most current medical practices available, unavoidable complications occasionally occur. If you have any problems or questions after discharge, please call your doctor. ACTIVITY  You may resume your regular activity but move at a slower pace for the next 24 hours.   Take frequent rest periods for the next 24 hours.   Walking will help expel (get rid of) the air and reduce the bloated feeling in your abdomen.   No driving for 24 hours (because of the anesthesia (medicine) used during the test).   You may shower.   Do not sign any important legal documents or operate any machinery for 24 hours (because of the anesthesia used during the test).  NUTRITION  Drink plenty of fluids.   You may resume your normal diet.   Begin with a light meal and progress to your normal diet.   Avoid alcoholic beverages for 24 hours or as instructed by your caregiver.  MEDICATIONS  You may resume your normal medications unless your caregiver tells you otherwise.  WHAT YOU CAN EXPECT TODAY  You may experience abdominal discomfort such as a feeling of fullness or gas pains.  FOLLOW-UP  Your doctor will discuss the results of your test with you.  SEEK IMMEDIATE MEDICAL ATTENTION IF ANY OF THE FOLLOWING OCCUR:  Excessive nausea (feeling sick to your stomach) and/or vomiting.   Severe abdominal pain and distention (swelling).   Trouble swallowing.   Temperature over 101 F (37.8 C).   Rectal bleeding or vomiting of blood.    GERD information provided  Continue Protonix 40 mg daily indefinitely  Take 30 minutes to eat a meal. Eat only very small  pieces of meat at a time. Make sure dentures fit well  Office visit with [place] in 6 months  Food Choices for Gastroesophageal Reflux Disease, Adult When you have gastroesophageal reflux disease (GERD), the foods you eat and your eating habits are very important. Choosing the right foods can help ease your discomfort. What guidelines do I need to follow?  Choose fruits, vegetables, whole grains, and low-fat dairy products.  Choose low-fat meat, fish, and poultry.  Limit fats such as oils, salad dressings, butter, nuts, and avocado.  Keep a food diary. This helps you identify foods that cause symptoms.  Avoid foods that cause symptoms. These may be different for everyone.  Eat small meals often instead of 3 large meals a day.  Eat your meals slowly, in a place where you are relaxed.  Limit fried foods.  Cook foods using methods other than frying.  Avoid drinking alcohol.  Avoid drinking large amounts of liquids with your meals.  Avoid bending over or lying down until 2-3 hours after eating. What foods are not recommended? These are some foods and drinks that may make your symptoms worse: Vegetables Tomatoes. Tomato juice. Tomato and spaghetti sauce. Chili peppers. Onion and garlic. Horseradish. Fruits Oranges, grapefruit, and lemon (fruit and juice). Meats High-fat meats, fish, and poultry. This includes hot dogs, ribs, ham, sausage, salami, and bacon. Dairy Whole milk and chocolate milk. Sour cream. Cream. Butter. Ice cream. Cream cheese. Drinks Coffee and tea. Bubbly (carbonated) drinks or energy drinks. Condiments Hot sauce. Barbecue sauce. Sweets/Desserts  Chocolate and cocoa. Donuts. Peppermint and spearmint. Fats and Oils High-fat foods. This includes [place] fries and potato chips. Other Vinegar. Strong spices. This includes black pepper, white pepper, red pepper, cayenne, curry powder, cloves, ginger, and chili powder. The items listed above may not be a complete  list of foods and drinks to avoid. Contact your dietitian for more information. This information is not intended to replace advice given to you by your health care provider. Make sure you discuss any questions you have with your health care provider. Document Released: 01/08/2012 Document Revised: 12/15/2015 Document Reviewed: 05/13/2013 Elsevier Interactive Patient Education  2017 [employer].

## 2017-04-11 NOTE — Op Note (Signed)
[facility] Patient Name: Albert Garner Procedure Date: 04/11/2017 3:14 PM MRN: [id number] Date of Birth: November 16, 1940 Attending MD: Norvel Richards , MD CSN: [id number] Age: 76 Admit Type: Outpatient Procedure:                Upper GI endoscopy Indications:              Dysphagia Providers:                Norvel Richards, MD, Janeece Riggers, RN, Randa Spike, Technician Referring MD:              Medicines:                Midazolam 3 mg IV, Meperidine 75 mg IV, Ondansetron                            4 mg IV Complications:            No immediate complications. Estimated Blood Loss:     Estimated blood loss was minimal. Procedure:                Pre-Anesthesia Assessment:                           - Prior to the procedure, a History and Physical                            was performed, and patient medications and                            allergies were reviewed. The patient's tolerance of                            previous anesthesia was also reviewed. The risks                            and benefits of the procedure and the sedation                            options and risks were discussed with the patient.                            All questions were answered, and informed consent                            was obtained. Prior Anticoagulants: The patient has                            taken no previous anticoagulant or antiplatelet                            agents. ASA Grade Assessment: II - A patient with  mild systemic disease. After reviewing the risks                            and benefits, the patient was deemed in                            satisfactory condition to undergo the procedure.                           After obtaining informed consent, the endoscope was                            passed under direct vision. Throughout the                            procedure, the patient's blood pressure,  pulse, and                            oxygen saturations were monitored continuously. The                            EG-[id number] ([id number]) scope was introduced through the                            mouth, and advanced to the second part of duodenum.                            The upper GI endoscopy was accomplished without                            difficulty. The patient tolerated the procedure                            well. Scope In: 3:36:10 PM Scope Out: 3:45:48 PM Total Procedure Duration: 0 hours 9 minutes 38 seconds  Findings:      A low-grade of narrowing Schatzki ring (acquired) was found at the       gastroesophageal junction. Esophagus otherwise appeared normal.      A small hiatal hernia was present. stomach otherwise normal.      The duodenal bulb and second portion of the duodenum were normal. The       scope was withdrawn. Dilation was performed with a Maloney dilator with       no resistance at 56 Fr. The scope was withdrawn. Dilation was performed       with a Maloney dilator with mild resistance at 69 Fr. The dilation site       was examined following endoscope reinsertion and showed moderate       improvement in luminal narrowing. Estimated blood loss was minimal. Impression:               - Low-grade of narrowing Schatzki ring. Dilated.                           - Small hiatal hernia.                           -  Normal duodenal bulb and second portion of the                            duodenum.                           - No specimens collected. Moderate Sedation:      Moderate (conscious) sedation was administered by the endoscopy nurse       and supervised by the endoscopist. The following parameters were       monitored: oxygen saturation, heart rate, blood pressure, respiratory       rate, EKG, adequacy of pulmonary ventilation, and response to care.       Total physician intraservice time was 16 minutes. Recommendation:           - Patient has a contact number  available for                            emergencies. The signs and symptoms of potential                            delayed complications were discussed with the                            patient. Return to normal activities tomorrow.                            Written discharge instructions were provided to the                            patient.                           - Resume previous diet. continue Protonix 40 mg                            daily indefinitely.                           - Continue present medications. Swallowing                            precautions reviewed. Office visitith [place] in 6                            months. Procedure Code(s):        --- Professional ---                           [phone], Esophagogastroduodenoscopy, flexible,                            transoral; diagnostic, including collection of                            specimen(s) by brushing or washing, when performed                            (  separate procedure)                           43450, Dilation of esophagus, by unguided sound or                            bougie, single or multiple passes                           99152, Moderate sedation services provided by the                            same physician or other qualified health care                            professional performing the diagnostic or                            therapeutic service that the sedation supports,                            requiring the presence of an independent trained                            observer to assist in the monitoring of the                            patient's level of consciousness and physiological                            status; initial 15 minutes of intraservice time,                            patient age 60 years or older Diagnosis Code(s):        --- Professional ---                           K22.2, Esophageal obstruction                           K44.9, Diaphragmatic hernia without  obstruction or                            gangrene                           R13.10, Dysphagia, unspecified CPT copyright 2016 American Medical Association. All rights reserved. The codes documented in this report are preliminary and upon coder review may  be revised to meet current compliance requirements. Cristopher Estimable. Alise Calais, MD Norvel Richards, MD 04/11/2017 4:06:52 PM This report has been signed electronically. Number of Addenda: 0

## 2017-04-11 NOTE — H&P (View-Only) (Signed)
Primary Care Physician:  [name], MD  Primary Gastroenterologist:  Garfield Cornea, MD   Chief Complaint  Patient presents with  . Dysphagia    repeat EGD-food impaction    HPI:  Albert Garner is a 76 y.o. male here To schedule EGD with dilation. He was seen in the ED back on 03/09/2017 for foreign body removal. He had bolus of food material impacted in the distal esophagus. Food bolus was able to be pushed through. Distal esophagus/GE junction was quite macerated. He had a Schatzki ring along with moderate-sized hiatal hernia. On a bit of food in the hernia sac, exam could not be completed. Esophagus was not dilated.  Patient states he had coconut candy lodged in his esophagus for 30 hours during that episode. He's lost 5 pounds since August 18. He's been eating a "mushy" diet. Denies heartburn. No abdominal pain. Bowel movements are regular. No blood in the stool or melena. No odynophagia.   Current Outpatient Prescriptions  Medication Sig Dispense Refill  . aspirin 81 MG tablet Take 81 mg by mouth daily.    Marland Kitchen atorvastatin (LIPITOR) 40 MG tablet TAKE ONE TABLET BY MOUTH ONCE DAILY 90 tablet 0  . lisinopril (PRINIVIL,ZESTRIL) 10 MG tablet TAKE ONE TABLET BY MOUTH ONCE DAILY 90 tablet 3  . pantoprazole (PROTONIX) 40 MG tablet Take 40 mg by mouth daily.    . traZODone (DESYREL) 50 MG tablet TAKE TWO TABLETS BY MOUTH AT BEDTIME 60 tablet 11   No current facility-administered medications for this visit.     Allergies as of 04/04/2017  . (No Known Allergies)    Past Medical History:  Diagnosis Date  . Allergy    occasional seasonal allergies  . Carotid artery occlusion   . Hyperlipidemia   . Hypertension     Past Surgical History:  Procedure Laterality Date  . COLONOSCOPY     2003 w/santagade-no report in epic  . COLONOSCOPY  01/2015   Dr. Ardis Hughs: four polyps removed, 3 tubular adenomas. next tcs 3 years  . ESOPHAGOGASTRODUODENOSCOPY N/A 03/09/2017   Procedure:  ESOPHAGOGASTRODUODENOSCOPY (EGD);  Surgeon: Daneil Dolin, MD;  [facility];  Service: Endoscopy;  Laterality: N/A;  . [place]   left hand 3 rd digit  . FOREIGN BODY REMOVAL N/A 03/09/2017   Procedure: FOREIGN BODY REMOVAL;  Surgeon: Daneil Dolin, MD;  Location: [facility];  Service: Endoscopy;  Laterality: N/A;  . INGUINAL HERNIA REPAIR Right 1986  . MOUTH SURGERY     with sedation-bottom teeth pulled    Family History  Problem Relation Age of Onset  . Heart disease Mother   . Throat cancer Brother   . Colon cancer Neg Hx   . Rectal cancer Neg Hx   . Stomach cancer Neg Hx     Social History   Social History  . Marital status: Married    Spouse name: N/A  . Number of children: N/A  . Years of education: N/A   Occupational History  . Not on file.   Social History Main Topics  . Smoking status: Never Smoker  . Smokeless tobacco: Current User    Types: Chew  . Alcohol use No  . Drug use: No  . Sexual activity: Yes   Other Topics Concern  . Not on file   Social History Narrative  . No narrative on file      ROS:  General: Negative for anorexia, weight loss, fever, chills, fatigue, weakness.  Eyes: Negative for vision changes.  ENT: Negative for hoarseness,nasal congestion. CV: Negative for chest pain, angina, palpitations, dyspnea on exertion, peripheral edema.  Respiratory: Negative for dyspnea at rest, dyspnea on exertion, cough, sputum, wheezing.  GI: See history of present illness. GU:  Negative for dysuria, hematuria, urinary incontinence, urinary frequency, nocturnal urination.  MS: Negative for joint pain, low back pain.  Derm: Negative for rash or itching.  Neuro: Negative for weakness, abnormal sensation, seizure, frequent headaches, memory loss, confusion.  Psych: Negative for anxiety, depression, suicidal ideation, hallucinations.  Endo: Negative for unusual weight change.  Heme: Negative for bruising or  bleeding. Allergy: Negative for rash or hives.    Physical Examination:  BP 137/65   Pulse 66   Temp 97.8 F (36.6 C) (Oral)   Ht 6' 1.5" (1.867 m)   Wt 150 lb 9.6 oz (68.3 kg)   BMI 19.60 kg/m    General: Well-nourished, well-developed in no acute distress.  Head: Normocephalic, atraumatic.   Eyes: Conjunctiva pink, no icterus. Mouth: Oropharyngeal mucosa moist and pink , no lesions erythema or exudate. Neck: Supple without thyromegaly, masses, or lymphadenopathy.  Lungs: Clear to auscultation bilaterally.  Heart: Regular rate and rhythm, no murmurs rubs or gallops.  Abdomen: Bowel sounds are normal, nontender, nondistended, no hepatosplenomegaly or masses, no abdominal bruits or    hernia , no rebound or guarding.   Rectal: Not performed Extremities: No lower extremity edema. No clubbing or deformities.  Neuro: Alert and oriented x 4 , grossly normal neurologically.  Skin: Warm and dry, no rash or jaundice.   Psych: Alert and cooperative, normal mood and affect.  Labs: Lab Results  Component Value Date   CREATININE 1.04 03/09/2017   BUN 23 (H) 03/09/2017   NA 141 03/09/2017   K 4.2 03/09/2017   CL 104 03/09/2017   CO2 27 03/09/2017   Lab Results  Component Value Date   ALT 12 05/18/2016   AST 18 05/18/2016   ALKPHOS 70 05/18/2016   BILITOT 0.7 05/18/2016   Lab Results  Component Value Date   WBC 6.0 03/09/2017   HGB 13.4 03/09/2017   HCT 39.9 03/09/2017   MCV 93.0 03/09/2017   PLT 155 03/09/2017     Imaging Studies: No results found.

## 2017-04-11 NOTE — Interval H&P Note (Signed)
History and Physical Interval Note:  04/11/2017 2:32 PM  Albert Garner  has presented today for surgery, with the diagnosis of DYSPHAGIA  The various methods of treatment have been discussed with the patient and family. After consideration of risks, benefits and other options for treatment, the patient has consented to  Procedure(s) with comments: ESOPHAGOGASTRODUODENOSCOPY (EGD) (N/A) - [address] [place] (N/A) as a surgical intervention .  The patient's history has been reviewed, patient examined, no change in status, stable for surgery.  I have reviewed the patient's chart and labs.  Questions were answered to the patient's satisfaction.     [name]  No change. EGD with the ED today as feasible/appropriate.  The risks, benefits, limitations, alternatives and imponderables have been reviewed with the patient. Potential for esophageal dilation, biopsy, etc. have also been reviewed.  Questions have been answered. All parties agreeable.

## 2017-04-12 DIAGNOSIS — R1310 Dysphagia, unspecified: Secondary | ICD-10-CM

## 2017-04-15 ENCOUNTER — Encounter (HOSPITAL_COMMUNITY): Payer: Self-pay | Admitting: Internal Medicine

## 2017-04-30 ENCOUNTER — Ambulatory Visit (HOSPITAL_COMMUNITY)
Admission: RE | Admit: 2017-04-30 | Discharge: 2017-04-30 | Disposition: A | Discharge status: Home / Self Care | Payer: Medicare Other | Source: Ambulatory Visit | Attending: Vascular Surgery | Admitting: Vascular Surgery

## 2017-04-30 ENCOUNTER — Encounter: Payer: Self-pay | Admitting: Family

## 2017-04-30 ENCOUNTER — Ambulatory Visit (INDEPENDENT_AMBULATORY_CARE_PROVIDER_SITE_OTHER): Payer: Medicare Other | Admitting: Family

## 2017-04-30 VITALS — BP 132/72 | HR 96 | Temp 98.8°F | Resp 16 | Ht 73.5 in | Wt 157.0 lb

## 2017-04-30 DIAGNOSIS — R0989 Other specified symptoms and signs involving the circulatory and respiratory systems: Secondary | ICD-10-CM

## 2017-04-30 DIAGNOSIS — I6523 Occlusion and stenosis of bilateral carotid arteries: Secondary | ICD-10-CM | POA: Diagnosis not present

## 2017-04-30 DIAGNOSIS — Z720 Tobacco use: Secondary | ICD-10-CM | POA: Diagnosis not present

## 2017-04-30 LAB — VAS US CAROTID
LCCADDIAS: -26 cm/s
LCCAPSYS: 187 cm/s
LEFT ECA DIAS: -13 cm/s
LEFT VERTEBRAL DIAS: -15 cm/s
LICADDIAS: -30 cm/s
LICAPDIAS: -18 cm/s
LICAPSYS: -86 cm/s
Left CCA dist sys: -128 cm/s
Left CCA prox dias: 29 cm/s
Left ICA dist sys: -121 cm/s
RIGHT CCA MID DIAS: 24 cm/s
RIGHT ECA DIAS: 38 cm/s
RIGHT VERTEBRAL DIAS: -18 cm/s
Right CCA prox dias: 22 cm/s
Right CCA prox sys: 129 cm/s
Right cca dist sys: -121 cm/s
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
## 2017-04-30 NOTE — Patient Instructions (Signed)
What You Need to Know About Smokeless Tobacco Use Tobacco use is one of the leading causes of cancer and other chronic health problems. Smokeless tobacco is tobacco that is put directly into the mouth instead of being smoked. It may also be called chewing tobacco or snuff. Smokeless tobacco is made from the leaves of tobacco plants and it comes in several forms:  Loose, dry leaves, plugs, or twists.  Moist pouches.  Dissolving lozenges or strips.  Chewing, sucking, or holding the tobacco in your mouth causes your mouth to make more saliva. The saliva mixes with the tobacco to make "tobacco juice" that is swallowed or spit out. How can smokeless tobacco affect me? Using smokeless tobacco:  Increases your risk of developing cancer. Smokeless tobacco contains at least 28 different types of cancer-causing chemicals (carcinogens).  Increases your chances of developing other long-term health problems, including high blood pressure, heart disease, stroke, and dental problems.  Can make you become addicted. Nicotine is one of the chemicals in tobacco. When you chew tobacco, you absorb nicotine from the tobacco juice. This can make you feel more alert than usual.  Can cause problems with pregnancy. Pregnant women who use smokeless tobacco are more likely to miscarry or deliver a baby too early (premature delivery).  Can affect the appearance and health of your mouth. Using smokeless tobacco may cause bad breath, yellow-brown teeth, mouth sores, cracking and bleeding lips, gum recession, and lesions on the soft tissues of your mouth (leukoplakia).  What are the benefits of not using smokeless tobacco? The benefits of not using smokeless tobacco include:  A healthy mind because: ? You avoid addiction.  A healthy body because: ? You avoid dental problems. ? You promote healthy pregnancy. ? You avoid long-term health problems.  A healthy wallet because: ? You avoid costs of buying  tobacco. ? You avoid health care costs in the future.  A healthy family because: ? You avoid accidental poisoning of children in your household.  What can happen if I continue to use smokeless tobacco? If you continue to use smokeless tobacco, you will increase your risk for developing certain cancers. These include:  Tongue.  Lips, mouth, and gums.  Throat (esophagus) and voice box (larynx).  Stomach.  Pancreas.  Bladder.  Colon.  Long-term use of smokeless tobacco can also lead to:  High blood pressure, heart disease, and stroke.  Gum disease, gum recession, and bone loss around the teeth.  Tooth decay.  How do I quit using smokeless tobacco? Quitting the use of smokeless tobacco can be hard, but it can be done. Follow these steps:  Pick a date to quit. Set a date within the next two weeks. This gives you time to prepare.  Write down the reasons why you are quitting. Keep this list in places where you will see it often, such as on your bathroom mirror or in your car or wallet.  Identify the people, places, things, and activities that make you want to use tobacco (triggers) and avoid them.  Get rid of any tobacco you have and remove any tobacco smells. To do this: ? Throw away all containers of tobacco at home, at work, and in your car. ? Throw away any other items that you use regularly when you chew tobacco. ? Clean your car and make sure to remove all tobacco-related items. ? Clean your home, including curtains and carpets.  Tell your family, friends, and coworkers that you are quitting. This can make quitting   easier.  Ask your health care provider for help quitting smokeless tobacco. This may involve treatment. Find out what treatment options are covered by your health insurance.  Keep track of how many days have passed since you quit. Remembering how long and hard you have worked to quit can help you avoid using tobacco again.  Where can I get support? Ask  your health care provider if there is a local support group for quitting smokeless tobacco. Where can I get more information? You can learn more about the risks of using smokeless tobacco and the benefits of quitting from these sources:  [place]: www.cancer.gov  American Cancer Society: www.cancer.org  When should I seek medical care? Seek medical care if you have:  White or other discolored patches in your mouth.  Difficulty swallowing.  A change in your voice.  Unexplained weight loss.  Stomach pain, nausea, or vomiting.  Summary  Smokeless tobacco contains at least 28 different chemicals that are known to cause cancer (carcinogen).  Nicotine is an addictive chemical in smokeless tobacco.  When you quit using smokeless tobacco, you lower your risk of developing cancer. This information is not intended to replace advice given to you by your health care provider. Make sure you discuss any questions you have with your health care provider. Document Released: 12/11/2010 Document Revised: 03/03/2016 Document Reviewed: 02/18/2015 Elsevier Interactive Patient Education  2017 [employer].     Stroke Prevention Some medical conditions and behaviors are associated with an increased chance of having a stroke. You may prevent a stroke by making healthy choices and managing medical conditions. How can I reduce my risk of having a stroke?  Stay physically active. Get at least 30 minutes of activity on most or all days.  Do not smoke. It may also be helpful to avoid exposure to secondhand smoke.  Limit alcohol use. Moderate alcohol use is considered to be: ? No more than 2 drinks per day for men. ? No more than 1 drink per day for nonpregnant women.  Eat healthy foods. This involves: ? Eating 5 or more servings of fruits and vegetables a day. ? Making dietary changes that address high blood pressure (hypertension), high cholesterol, diabetes, or  obesity.  Manage your cholesterol levels. ? Making food choices that are high in fiber and low in saturated fat, trans fat, and cholesterol may control cholesterol levels. ? Take any prescribed medicines to control cholesterol as directed by your health care provider.  Manage your diabetes. ? Controlling your carbohydrate and sugar intake is recommended to manage diabetes. ? Take any prescribed medicines to control diabetes as directed by your health care provider.  Control your hypertension. ? Making food choices that are low in salt (sodium), saturated fat, trans fat, and cholesterol is recommended to manage hypertension. ? Ask your health care provider if you need treatment to lower your blood pressure. Take any prescribed medicines to control hypertension as directed by your health care provider. ? If you are 18-68 years of age, have your blood pressure checked every 3-5 years. If you are 81 years of age or older, have your blood pressure checked every year.  Maintain a healthy weight. ? Reducing calorie intake and making food choices that are low in sodium, saturated fat, trans fat, and cholesterol are recommended to manage weight.  Stop drug abuse.  Avoid taking birth control pills. ? Talk to your health care provider about the risks of taking birth control pills if you are over  22 years old, smoke, get migraines, or have ever had a blood clot.  Get evaluated for sleep disorders (sleep apnea). ? Talk to your health care provider about getting a sleep evaluation if you snore a lot or have excessive sleepiness.  Take medicines only as directed by your health care provider. ? For some people, aspirin or blood thinners (anticoagulants) are helpful in reducing the risk of forming abnormal blood clots that can lead to stroke. If you have the irregular heart rhythm of atrial fibrillation, you should be on a blood thinner unless there is a good reason you cannot take them. ? Understand all  your medicine instructions.  Make sure that other conditions (such as anemia or atherosclerosis) are addressed. Get help right away if:  You have sudden weakness or numbness of the face, arm, or leg, especially on one side of the body.  Your face or eyelid droops to one side.  You have sudden confusion.  You have trouble speaking (aphasia) or understanding.  You have sudden trouble seeing in one or both eyes.  You have sudden trouble walking.  You have dizziness.  You have a loss of balance or coordination.  You have a sudden, severe headache with no known cause.  You have new chest pain or an irregular heartbeat. Any of these symptoms may represent a serious problem that is an emergency. Do not wait to see if the symptoms will go away. Get medical help at once. Call your local emergency services (911 in [place] Do not drive yourself to the hospital. This information is not intended to replace advice given to you by your health care provider. Make sure you discuss any questions you have with your health care provider. Document Released: 08/16/2004 Document Revised: 12/15/2015 Document Reviewed: 01/09/2013 Elsevier Interactive Patient Education  2017 [employer].

## 2017-05-20 ENCOUNTER — Encounter: Payer: Self-pay | Admitting: Family Medicine

## 2017-05-20 ENCOUNTER — Ambulatory Visit (INDEPENDENT_AMBULATORY_CARE_PROVIDER_SITE_OTHER): Payer: Medicare Other | Admitting: Family Medicine

## 2017-05-20 VITALS — BP 130/60 | HR 62 | Temp 97.8°F | Resp 16 | Ht 73.5 in | Wt 154.0 lb

## 2017-05-20 DIAGNOSIS — I2510 Atherosclerotic heart disease of native coronary artery without angina pectoris: Secondary | ICD-10-CM | POA: Diagnosis not present

## 2017-05-20 DIAGNOSIS — Z0000 Encounter for general adult medical examination without abnormal findings: Secondary | ICD-10-CM | POA: Diagnosis not present

## 2017-05-20 DIAGNOSIS — Z125 Encounter for screening for malignant neoplasm of prostate: Secondary | ICD-10-CM

## 2017-05-20 DIAGNOSIS — I10 Essential (primary) hypertension: Secondary | ICD-10-CM

## 2017-05-20 DIAGNOSIS — E7800 Pure hypercholesterolemia, unspecified: Secondary | ICD-10-CM

## 2017-05-20 LAB — CBC WITH DIFFERENTIAL/PLATELET
BASOS PCT: 0.5 %
Basophils Absolute: 20 cells/uL (ref 0–200)
EOS ABS: 48 {cells}/uL (ref 15–500)
EOS PCT: 1.2 %
HCT: 35.3 % — ABNORMAL LOW (ref 38.5–50.0)
Hemoglobin: 12 g/dL — ABNORMAL LOW (ref 13.2–17.1)
Lymphs Abs: 1008 cells/uL (ref 850–3900)
MCH: 31.3 pg (ref 27.0–33.0)
MCHC: 34 g/dL (ref 32.0–36.0)
MCV: 92.2 fL (ref 80.0–100.0)
MPV: 10.2 fL (ref 7.5–12.5)
Monocytes Relative: 9 %
Neutro Abs: 2564 cells/uL (ref 1500–7800)
Neutrophils Relative %: 64.1 %
PLATELETS: 149 10*3/uL (ref 140–400)
RBC: 3.83 10*6/uL — ABNORMAL LOW (ref 4.20–5.80)
RDW: 12.3 % (ref 11.0–15.0)
TOTAL LYMPHOCYTE: 25.2 %
WBC mixed population: 360 cells/uL (ref 200–950)
WBC: 4 10*3/uL (ref 3.8–10.8)

## 2017-05-20 LAB — COMPLETE METABOLIC PANEL WITH GFR
AG RATIO: 1.7 (calc) (ref 1.0–2.5)
ALBUMIN MSPROF: 4 g/dL (ref 3.6–5.1)
ALT: 17 U/L (ref 9–46)
AST: 19 U/L (ref 10–35)
Alkaline phosphatase (APISO): 71 U/L (ref 40–115)
BUN: 18 mg/dL (ref 7–25)
CALCIUM: 9.2 mg/dL (ref 8.6–10.3)
CO2: 30 mmol/L (ref 20–32)
CREATININE: 0.94 mg/dL (ref 0.70–1.18)
Chloride: 105 mmol/L (ref 98–110)
GFR, EST NON AFRICAN AMERICAN: 78 mL/min/{1.73_m2} (ref >=60)
GFR, Est African American: 91 mL/min/{1.73_m2} (ref >=60)
GLOBULIN: 2.4 g/dL (ref 1.9–3.7)
Glucose, Bld: 94 mg/dL (ref 65–99)
POTASSIUM: 4.6 mmol/L (ref 3.5–5.3)
SODIUM: 141 mmol/L (ref 135–146)
TOTAL PROTEIN: 6.4 g/dL (ref 6.1–8.1)
Total Bilirubin: 0.8 mg/dL (ref 0.2–1.2)

## 2017-05-20 LAB — LIPID PANEL
CHOL/HDL RATIO: 2.3 (calc) (ref <5.0)
Cholesterol: 123 mg/dL (ref <200)
HDL: 53 mg/dL (ref >40)
LDL Cholesterol (Calc): 54 mg/dL (calc)
NON-HDL CHOLESTEROL (CALC): 70 mg/dL (ref <130)
Triglycerides: 75 mg/dL (ref <150)

## 2017-05-20 LAB — PSA: PSA: 0.6 ng/mL (ref <=4.0)

## 2017-05-20 NOTE — Progress Notes (Signed)
Subjective:    Patient ID: Albert Garner, male    DOB: 1941-03-07, 76 y.o.   MRN: [id number]  HPI5/23/16 Patient is a very pleasant 76 year old white male here today for CPE. Past medical history is significant for bilateral occlusion of the carotid arteries. Each is less than 40% and is being managed medically with statin therapy and an antiplatelet drug.  Recent carotid Doppler showed no significant change in the stenosis of the carotid arteries. He also has a history of hypertension and hyperlipidemia.  His last colonoscopy was in 2016 and is due again in 2019.  Patient underwent esophageal dilatation for esophageal stricture by gastroenterology in [place].  He would like to go there for his follow-up colonoscopy.  He is overdue for a prostate exam. We will perform a prostate exam today.  Patient is due today for a flu shot which he adamantly declines.  He has had Pneumovax 23 in the remote past.  He had Prevnar 13 in 2016.  He is due for the shingles shot.  He declines a shingles shot at the present time as well   Past Medical History:  Diagnosis Date  . Allergy    occasional seasonal allergies  . Carotid artery occlusion   . Hyperlipidemia   . Hypertension    Past Surgical History:  Procedure Laterality Date  . COLONOSCOPY     2003 w/santagade-no report in epic  . COLONOSCOPY  01/2015   Dr. Ardis Hughs: four polyps removed, 3 tubular adenomas. next tcs 3 years  . ESOPHAGOGASTRODUODENOSCOPY N/A 03/09/2017   Procedure: ESOPHAGOGASTRODUODENOSCOPY (EGD);  Surgeon: Daneil Dolin, MD;  [facility];  Service: Endoscopy;  Laterality: N/A;  . ESOPHAGOGASTRODUODENOSCOPY N/A 04/11/2017   Procedure: ESOPHAGOGASTRODUODENOSCOPY (EGD);  Surgeon: Daneil Dolin, MD;  Location: [facility];  Service: Endoscopy;  Laterality: N/A;  [address]  . [place]   left hand 3 rd digit  . FOREIGN BODY REMOVAL N/A 03/09/2017   Procedure: FOREIGN BODY REMOVAL;  Surgeon: Daneil Dolin, MD;   [facility];  Service: Endoscopy;  Laterality: N/A;  . INGUINAL HERNIA REPAIR Right 1986  . MALONEY DILATION N/A 04/11/2017   Procedure: [name] DILATION;  Surgeon: Daneil Dolin, MD;  [facility];  Service: Endoscopy;  Laterality: N/A;  . MOUTH SURGERY     with sedation-bottom teeth pulled   Current Outpatient Prescriptions on File Prior to Visit  Medication Sig Dispense Refill  . aspirin 81 MG tablet Take 81 mg by mouth daily.    Marland Kitchen atorvastatin (LIPITOR) 40 MG tablet TAKE ONE TABLET BY MOUTH ONCE DAILY 90 tablet 0  . lisinopril (PRINIVIL,ZESTRIL) 10 MG tablet TAKE ONE TABLET BY MOUTH ONCE DAILY 90 tablet 3  . Nutritional Supplements (ENSURE ORIGINAL PO) Take 1 Bottle by mouth 2 (two) times daily.    . pantoprazole (PROTONIX) 40 MG tablet Take 40 mg by mouth daily.    . traZODone (DESYREL) 50 MG tablet TAKE TWO TABLETS BY MOUTH AT BEDTIME 60 tablet 11   No current facility-administered medications on file prior to visit.    No Known Allergies Social History   Social History  . Marital status: Married    Spouse name: N/A  . Number of children: N/A  . Years of education: N/A   Occupational History  . Not on file.   Social History Main Topics  . Smoking status: Never Smoker  . Smokeless tobacco: Current User    Types: Chew  .  Alcohol use No  . Drug use: No  . Sexual activity: Yes   Other Topics Concern  . Not on file   Social History Narrative  . No narrative on file   Family History  Problem Relation Age of Onset  . Heart disease Mother   . Throat cancer Brother   . Colon cancer Neg Hx   . Rectal cancer Neg Hx   . Stomach cancer Neg Hx       Review of Systems  All other systems reviewed and are negative.      Objective:   Physical Exam  Constitutional: He is oriented to person, place, and time. He appears well-developed and well-nourished. No distress.  HENT:  Head: Normocephalic and atraumatic.  Right Ear: External ear normal.    Left Ear: External ear normal.  Nose: Nose normal.  Mouth/Throat: Oropharynx is clear and moist. No oropharyngeal exudate.  Eyes: Pupils are equal, round, and reactive to light. Conjunctivae and EOM are normal. Right eye exhibits no discharge. Left eye exhibits no discharge. No scleral icterus.  Neck: Normal range of motion. Neck supple. No JVD present. No tracheal deviation present. No thyromegaly present.  Cardiovascular: Normal rate, regular rhythm, normal heart sounds and intact distal pulses.  Exam reveals no gallop and no friction rub.   No murmur heard. Pulmonary/Chest: Effort normal and breath sounds normal. No stridor. No respiratory distress. He has no wheezes. He has no rales. He exhibits no tenderness.  Abdominal: Soft. Bowel sounds are normal. He exhibits no distension and no mass. There is no tenderness. There is no rebound and no guarding.  Genitourinary: Rectum normal, prostate normal and penis normal.  Musculoskeletal: Normal range of motion. He exhibits no edema or tenderness.  Lymphadenopathy:    He has no cervical adenopathy.  Neurological: He is alert and oriented to person, place, and time. He has normal reflexes. No cranial nerve deficit. He exhibits normal muscle tone. Coordination normal.  Skin: Skin is warm. No rash noted. He is not diaphoretic. There is erythema. No pallor.  Psychiatric: He has a normal mood and affect. His behavior is normal. Judgment and thought content normal.  Vitals reviewed.       Assessment & Plan:  Pure hypercholesterolemia - Plan: COMPLETE METABOLIC PANEL WITH GFR, Lipid panel, PSA, CBC with Differential/Platelet  Prostate cancer screening  Routine general medical examination at a health care facility  Benign essential HTN  ASCVD (arteriosclerotic cardiovascular disease)  Physical exam today is completely normal.  Blood pressure is excellent.  I will check a fasting lipid panel.  Goal LDL cholesterol is less than 70.  Continue  aspirin antiplatelet therapy for his carotid artery stenosis.  Check a CBC to monitor for anemia or bone marrow abnormalities.  Prostate exam was normal but I will check a PSA to complete screening for prostate cancer.  He will be due for colonoscopy next year.  He refuses the flu shot.  He declines the shingles vaccine.  He denies any problems with falls or depression.  He denies any memory problems or problems managing his finances are performing his ADLs.

## 2017-06-10 ENCOUNTER — Other Ambulatory Visit: Payer: Self-pay | Admitting: Family Medicine

## 2017-09-09 ENCOUNTER — Other Ambulatory Visit: Payer: Self-pay | Admitting: Internal Medicine

## 2017-09-17 ENCOUNTER — Other Ambulatory Visit: Payer: Self-pay | Admitting: Family Medicine

## 2017-10-09 ENCOUNTER — Other Ambulatory Visit: Payer: Self-pay

## 2017-10-09 ENCOUNTER — Encounter: Payer: Self-pay | Admitting: Gastroenterology

## 2017-10-09 ENCOUNTER — Ambulatory Visit: Payer: Medicare Other | Admitting: Gastroenterology

## 2017-10-09 VITALS — BP 122/58 | HR 70 | Temp 97.6°F | Ht 73.0 in | Wt 161.2 lb

## 2017-10-09 DIAGNOSIS — D126 Benign neoplasm of colon, unspecified: Secondary | ICD-10-CM | POA: Diagnosis not present

## 2017-10-09 DIAGNOSIS — R1319 Other dysphagia: Secondary | ICD-10-CM

## 2017-10-09 DIAGNOSIS — D649 Anemia, unspecified: Secondary | ICD-10-CM

## 2017-10-09 DIAGNOSIS — R1310 Dysphagia, unspecified: Secondary | ICD-10-CM | POA: Diagnosis not present

## 2017-10-09 DIAGNOSIS — Z86010 Personal history of colonic polyps: Secondary | ICD-10-CM

## 2017-10-09 MED ORDER — PEG 3350-KCL-NA BICARB-NACL 420 G PO SOLR: 4000.0000 mL | ORAL | 0 refills | Status: DC

## 2017-10-09 NOTE — Assessment & Plan Note (Signed)
77 year old gentleman presenting for follow-up of esophageal dysphagia.  Symptoms resolved status post dilation.  Clinically feels well except for some mild dizziness when he lays down or stands up too quickly.  Noted to have new mild anemia on labs in October.  We will recheck those today.  He sees his PCP in the near future we will address the dizziness with him further.    Patient is due for adenomatous colon polyps in the upcoming few months for 3-year follow-up surveillance.  He would like to go ahead and get on the schedule now which is reasonable.  I have discussed the risks, alternatives, benefits with regards to but not limited to the risk of reaction to medication, bleeding, infection, perforation and the patient is agreeable to proceed. Written consent to be obtained.

## 2017-10-09 NOTE — Progress Notes (Signed)
Primary Care Physician: [name], MD  Primary Gastroenterologist:  Garfield Cornea, MD   Chief Complaint  Patient presents with  . Dysphagia    f/u, doing ok    HPI: Albert Garner is a 77 y.o. male here for follow-up.  Patient has a history of dysphagia.  In August he had EGD for foreign body removal when he presented to the emergency department.  At that time his esophagus was quite macerated.  He came back in September for EGD with dilation.  At that time his esophagus was normal except for mild narrowing Schatzki ring status post dilation, small hiatal hernia.  Patient is due for surveillance colonoscopy in July of this year for history of adenomatous colon polyps.  Wants to come off PPI.  Never had heartburn.  Swallowing issues have now resolved.  We discussed possibility of him being a silent refluxer and he could have recurrent issues in the future off of PPI but it is reasonable to stop it and monitor.  He denies abdominal pain.  His appetite is good.  No weight loss.  Bowel function is normal.  No blood in the stool or melena.  Current Outpatient Medications  Medication Sig Dispense Refill  . aspirin 81 MG tablet Take 81 mg by mouth daily.    Marland Kitchen atorvastatin (LIPITOR) 40 MG tablet TAKE 1 TABLET BY MOUTH ONCE DAILY 90 tablet 1  . lisinopril (PRINIVIL,ZESTRIL) 10 MG tablet TAKE ONE TABLET BY MOUTH ONCE DAILY 90 tablet 3  . Nutritional Supplements (ENSURE ORIGINAL PO) Take 1 Bottle by mouth. Drinks 1-3 daily    . pantoprazole (PROTONIX) 40 MG tablet TAKE 1 TABLET BY MOUTH ONCE DAILY 90 tablet 3  . traZODone (DESYREL) 50 MG tablet TAKE TWO TABLETS BY MOUTH AT BEDTIME 60 tablet 11   No current facility-administered medications for this visit.     Allergies as of 10/09/2017  . (No Known Allergies)   Past Medical History:  Diagnosis Date  . Allergy    occasional seasonal allergies  . Carotid artery occlusion   . Hyperlipidemia   . Hypertension    Past Surgical  History:  Procedure Laterality Date  . COLONOSCOPY     2003 w/santagade-no report in epic  . COLONOSCOPY  01/2015   Dr. Ardis Hughs: four polyps removed, 3 tubular adenomas. next tcs 3 years  . ESOPHAGOGASTRODUODENOSCOPY N/A 03/09/2017   Dr. Gala Romney: Distal esophagus quite macerated, Schatzki ring not dilated, retained food in the hernia sac.  Marland Kitchen ESOPHAGOGASTRODUODENOSCOPY N/A 04/11/2017   Dr. Gala Romney: low grade of narrowing Schatzki ring s/p dilation  . [place]   left hand 3 rd digit  . FOREIGN BODY REMOVAL N/A 03/09/2017   Procedure: FOREIGN BODY REMOVAL;  Surgeon: Daneil Dolin, MD;  [facility];  Service: Endoscopy;  Laterality: N/A;  . INGUINAL HERNIA REPAIR Right 1986  . MALONEY DILATION N/A 04/11/2017   Procedure: [name] DILATION;  Surgeon: Daneil Dolin, MD;  [facility];  Service: Endoscopy;  Laterality: N/A;  . MOUTH SURGERY     with sedation-bottom teeth pulled   Family History  Problem Relation Age of Onset  . Heart disease Mother   . Throat cancer Brother   . Colon cancer Neg Hx   . Rectal cancer Neg Hx   . Stomach cancer Neg Hx    Social History   Tobacco Use  . Smoking status: Never Smoker  . Smokeless tobacco: Current User  Types: Chew  Substance Use Topics  . Alcohol use: No  . Drug use: No    ROS:  General: Negative for anorexia, weight loss, fever, chills, fatigue, weakness. ENT: Negative for hoarseness, difficulty swallowing , nasal congestion. CV: Negative for chest pain, angina, palpitations, dyspnea on exertion, peripheral edema.  Respiratory: Negative for dyspnea at rest, dyspnea on exertion, cough, sputum, wheezing.  GI: See history of present illness. GU:  Negative for dysuria, hematuria, urinary incontinence, urinary frequency, nocturnal urination.  Endo: Negative for unusual weight change.    Physical Examination:   BP (!) 122/58   Pulse 70   Temp 97.6 F (36.4 C) (Oral)   Ht 6\' 1"  (1.854 m)   Wt 161  lb 3.2 oz (73.1 kg)   BMI 21.27 kg/m   General: Well-nourished, well-developed in no acute distress.  Eyes: No icterus. Mouth: Oropharyngeal mucosa moist and pink , no lesions erythema or exudate. Lungs: Clear to auscultation bilaterally.  Heart: Regular rate and rhythm, no murmurs rubs or gallops.  Abdomen: Bowel sounds are normal, nontender, nondistended, no hepatosplenomegaly or masses, no abdominal bruits or hernia , no rebound or guarding.   Extremities: No lower extremity edema. No clubbing or deformities. Neuro: Alert and oriented x 4   Skin: Warm and dry, no jaundice.   Psych: Alert and cooperative, normal mood and affect.  Labs:  Lab Results  Component Value Date   CREATININE 0.94 05/20/2017   BUN 18 05/20/2017   NA 141 05/20/2017   K 4.6 05/20/2017   CL 105 05/20/2017   CO2 30 05/20/2017    Lab Results  Component Value Date   WBC 4.0 05/20/2017   HGB 12.0 (L) 05/20/2017   HCT 35.3 (L) 05/20/2017   MCV 92.2 05/20/2017   PLT 149 05/20/2017    Imaging Studies: No results found.

## 2017-10-09 NOTE — Patient Instructions (Signed)
1. Colonoscopy in 11/2017. See separate instructions.  2. You can stop pantoprazole. Watch for any recurrent swallowing issues or frequent heartburn.

## 2017-10-09 NOTE — Progress Notes (Signed)
cc'ed to pcp °

## 2017-10-10 ENCOUNTER — Telehealth: Payer: Self-pay

## 2017-10-10 DIAGNOSIS — D649 Anemia, unspecified: Secondary | ICD-10-CM | POA: Diagnosis not present

## 2017-10-10 LAB — CBC WITH DIFFERENTIAL/PLATELET
BASOS PCT: 0.2 %
Basophils Absolute: 9 cells/uL (ref 0–200)
EOS PCT: 1.1 %
Eosinophils Absolute: 50 cells/uL (ref 15–500)
HCT: 35 % — ABNORMAL LOW (ref 38.5–50.0)
HEMOGLOBIN: 12 g/dL — AB (ref 13.2–17.1)
Lymphs Abs: 1080 cells/uL (ref 850–3900)
MCH: 31.2 pg (ref 27.0–33.0)
MCHC: 34.3 g/dL (ref 32.0–36.0)
MCV: 90.9 fL (ref 80.0–100.0)
MONOS PCT: 9.3 %
MPV: 10.1 fL (ref 7.5–12.5)
NEUTROS ABS: 2943 {cells}/uL (ref 1500–7800)
Neutrophils Relative %: 65.4 %
Platelets: 149 10*3/uL (ref 140–400)
RBC: 3.85 10*6/uL — ABNORMAL LOW (ref 4.20–5.80)
RDW: 12.3 % (ref 11.0–15.0)
Total Lymphocyte: 24 %
WBC mixed population: 419 cells/uL (ref 200–950)
WBC: 4.5 10*3/uL (ref 3.8–10.8)

## 2017-10-10 NOTE — Telephone Encounter (Signed)
Pt called saying he had a missed call from our office. Spoke with other nurses and no documentation on calling pt. Our office will call pt when his lab results return.

## 2017-10-18 NOTE — Progress Notes (Signed)
Mild anemia stable. Await colonoscopy findings.

## 2017-11-18 ENCOUNTER — Encounter: Payer: Self-pay | Admitting: Internal Medicine

## 2017-11-28 ENCOUNTER — Telehealth: Payer: Self-pay | Admitting: *Deleted

## 2017-11-28 NOTE — Telephone Encounter (Signed)
Daughter called in. Patient scheduled for procedure tomorrow. They have lost the instructions. I discussed in detail prep instructions. I asked her if he started clear liquids yesterday 11/27/17 after lunch. She was not sure if he did or not. She is going to find out and call back to let us know.

## 2017-11-28 NOTE — Telephone Encounter (Signed)
He needs to reschedule. Something needs to be done about patients getting the message about prep instructions. The second one this morning.

## 2017-11-28 NOTE — Telephone Encounter (Signed)
Spoke with patient daughter Deneise Lever. Patient r/s'd to 01/31/18 at 7:30am. Daughter asked me to send her the prep instructions. I stressed the importance of patient following the directions for prep. If they happen to lose the instructions again to call us in advanced prior to procedure being done and not day before. Called [facility] and made aware of new date/time. Patient also mailed instructions as well

## 2017-11-28 NOTE — Telephone Encounter (Signed)
Spoke with daughter. Patient did not start clear liquids yesterday. He had lunch and dinner.  Patient ate breakfast this morning as well. He ate eggs, grits, toast, coffee, chocolate cookies and peaches so far this morning. Please advise Dr. Gala Romney if patient needs to r/s.

## 2017-12-02 NOTE — Telephone Encounter (Signed)
Reviewed

## 2018-01-27 ENCOUNTER — Telehealth: Payer: Self-pay | Admitting: Internal Medicine

## 2018-01-27 MED ORDER — PEG 3350-KCL-NA BICARB-NACL 420 G PO SOLR: 4000.0000 mL | Freq: Once | ORAL | 0 refills | Status: AC

## 2018-01-27 NOTE — Telephone Encounter (Signed)
PATIENT NEEDS PREP FOR TCS CALLED INTO [facility] IN [place], THE PREP SAMPLE HE HAD FROM LAST TIME IS EXPIRED.  HIS DAUGHTERS NUMBER IS [phone]

## 2018-01-27 NOTE — Telephone Encounter (Signed)
Rx for trilyte has been sent in. Patient daughter is aware.

## 2018-01-31 ENCOUNTER — Encounter (HOSPITAL_COMMUNITY): Payer: Self-pay | Admitting: *Deleted

## 2018-01-31 ENCOUNTER — Encounter (HOSPITAL_COMMUNITY)
Admission: RE | Disposition: A | Discharge status: Home / Self Care | Payer: Self-pay | Source: Ambulatory Visit | Attending: Internal Medicine

## 2018-01-31 ENCOUNTER — Ambulatory Visit (HOSPITAL_COMMUNITY)
Admission: RE | Admit: 2018-01-31 | Discharge: 2018-01-31 | Disposition: A | Discharge status: Home / Self Care | Payer: Medicare Other | Source: Ambulatory Visit | Attending: Internal Medicine | Admitting: Internal Medicine

## 2018-01-31 ENCOUNTER — Other Ambulatory Visit: Payer: Self-pay

## 2018-01-31 DIAGNOSIS — Z808 Family history of malignant neoplasm of other organs or systems: Secondary | ICD-10-CM | POA: Insufficient documentation

## 2018-01-31 DIAGNOSIS — Z86010 Personal history of colonic polyps: Secondary | ICD-10-CM | POA: Diagnosis not present

## 2018-01-31 DIAGNOSIS — Z7982 Long term (current) use of aspirin: Secondary | ICD-10-CM | POA: Diagnosis not present

## 2018-01-31 DIAGNOSIS — E7800 Pure hypercholesterolemia, unspecified: Secondary | ICD-10-CM | POA: Insufficient documentation

## 2018-01-31 DIAGNOSIS — Z1211 Encounter for screening for malignant neoplasm of colon: Secondary | ICD-10-CM | POA: Insufficient documentation

## 2018-01-31 DIAGNOSIS — Z79899 Other long term (current) drug therapy: Secondary | ICD-10-CM | POA: Diagnosis not present

## 2018-01-31 DIAGNOSIS — I10 Essential (primary) hypertension: Secondary | ICD-10-CM | POA: Insufficient documentation

## 2018-01-31 DIAGNOSIS — E785 Hyperlipidemia, unspecified: Secondary | ICD-10-CM | POA: Diagnosis not present

## 2018-01-31 HISTORY — PX: COLONOSCOPY: SHX5424

## 2018-01-31 HISTORY — DX: Pure hypercholesterolemia, unspecified: E78.00

## 2018-01-31 SURGERY — COLONOSCOPY
Anesthesia: Moderate Sedation

## 2018-01-31 MED ORDER — SODIUM CHLORIDE 0.9 % IV SOLN
INTRAVENOUS | Status: DC
  Administered 2018-01-31: 07:00:00 via INTRAVENOUS

## 2018-01-31 MED ORDER — ONDANSETRON HCL 4 MG/2ML IJ SOLN
INTRAMUSCULAR | Status: AC
  Filled 2018-01-31: qty 2

## 2018-01-31 MED ORDER — MEPERIDINE HCL 100 MG/ML IJ SOLN
INTRAMUSCULAR | Status: AC
  Filled 2018-01-31: qty 2

## 2018-01-31 MED ORDER — MIDAZOLAM HCL 5 MG/5ML IJ SOLN
INTRAMUSCULAR | Status: AC
  Filled 2018-01-31: qty 10

## 2018-01-31 MED ORDER — MIDAZOLAM HCL 5 MG/5ML IJ SOLN
INTRAMUSCULAR | Status: DC | PRN
  Administered 2018-01-31 (×2): 1 mg via INTRAVENOUS

## 2018-01-31 MED ORDER — ONDANSETRON HCL 4 MG/2ML IJ SOLN
INTRAMUSCULAR | Status: DC | PRN
  Administered 2018-01-31: 4 mg via INTRAVENOUS

## 2018-01-31 MED ORDER — MEPERIDINE HCL 100 MG/ML IJ SOLN
INTRAMUSCULAR | Status: DC | PRN
  Administered 2018-01-31: 25 mg via INTRAVENOUS

## 2018-01-31 NOTE — Discharge Instructions (Signed)
Colonoscopy Discharge Instructions  Read the instructions outlined below and refer to this sheet in the next few weeks. These discharge instructions provide you with general information on caring for yourself after you leave the hospital. Your doctor may also give you specific instructions. While your treatment has been planned according to the most current medical practices available, unavoidable complications occasionally occur. If you have any problems or questions after discharge, call Dr. Gala Romney at [phone]. ACTIVITY  You may resume your regular activity, but move at a slower pace for the next 24 hours.   Take frequent rest periods for the next 24 hours.   Walking will help get rid of the air and reduce the bloated feeling in your belly (abdomen).   No driving for 24 hours (because of the medicine (anesthesia) used during the test).    Do not sign any important legal documents or operate any machinery for 24 hours (because of the anesthesia used during the test).  NUTRITION  Drink plenty of fluids.   You may resume your normal diet as instructed by your doctor.   Begin with a light meal and progress to your normal diet. Heavy or fried foods are harder to digest and may make you feel sick to your stomach (nauseated).   Avoid alcoholic beverages for 24 hours or as instructed.  MEDICATIONS  You may resume your normal medications unless your doctor tells you otherwise.  WHAT YOU CAN EXPECT TODAY  Some feelings of bloating in the abdomen.   Passage of more gas than usual.   Spotting of blood in your stool or on the toilet paper.  IF YOU HAD POLYPS REMOVED DURING THE COLONOSCOPY:  No aspirin products for 7 days or as instructed.   No alcohol for 7 days or as instructed.   Eat a soft diet for the next 24 hours.  FINDING OUT THE RESULTS OF YOUR TEST Not all test results are available during your visit. If your test results are not back during the visit, make an appointment  with your caregiver to find out the results. Do not assume everything is normal if you have not heard from your caregiver or the medical facility. It is important for you to follow up on all of your test results.  SEEK IMMEDIATE MEDICAL ATTENTION IF:  You have more than a spotting of blood in your stool.   Your belly is swollen (abdominal distention).   You are nauseated or vomiting.   You have a temperature over 101.   You have abdominal pain or discomfort that is severe or gets worse throughout the day.    Diverticulosis information provided  I do not recommend a future colonoscopy unless new symptoms develop   Diverticulosis Diverticulosis is a condition that develops when small pouches (diverticula) form in the wall of the large intestine (colon). The colon is where water is absorbed and stool is formed. The pouches form when the inside layer of the colon pushes through weak spots in the outer layers of the colon. You may have a few pouches or many of them. What are the causes? The cause of this condition is not known. What increases the risk? The following factors may make you more likely to develop this condition:  Being older than age 50. Your risk for this condition increases with age. Diverticulosis is rare among people younger than age 85. By age 55, many people have it.  Eating a low-fiber diet.  Having frequent constipation.  Being overweight.  Not getting enough exercise.  Smoking.  Taking over-the-counter pain medicines, like aspirin and ibuprofen.  Having a family history of diverticulosis.  What are the signs or symptoms? In most people, there are no symptoms of this condition. If you do have symptoms, they may include:  Bloating.  Cramps in the abdomen.  Constipation or diarrhea.  Pain in the lower left side of the abdomen.  How is this diagnosed? This condition is most often diagnosed during an exam for other colon problems. Because  diverticulosis usually has no symptoms, it often cannot be diagnosed independently. This condition may be diagnosed by:  Using a flexible scope to examine the colon (colonoscopy).  Taking an X-ray of the colon after dye has been put into the colon (barium enema).  Doing a CT scan.  How is this treated? You may not need treatment for this condition if you have never developed an infection related to diverticulosis. If you have had an infection before, treatment may include:  Eating a high-fiber diet. This may include eating more fruits, vegetables, and grains.  Taking a fiber supplement.  Taking a live bacteria supplement (probiotic).  Taking medicine to relax your colon.  Taking antibiotic medicines.  Follow these instructions at home:  Drink 6-8 glasses of water or more each day to prevent constipation.  Try not to strain when you have a bowel movement.  If you have had an infection before: ? Eat more fiber as directed by your health care provider or your diet and nutrition specialist (dietitian). ? Take a fiber supplement or probiotic, if your health care provider approves.  Take over-the-counter and prescription medicines only as told by your health care provider.  If you were prescribed an antibiotic, take it as told by your health care provider. Do not stop taking the antibiotic even if you start to feel better.  Keep all follow-up visits as told by your health care provider. This is important. Contact a health care provider if:  You have pain in your abdomen.  You have bloating.  You have cramps.  You have not had a bowel movement in 3 days. Get help right away if:  Your pain gets worse.  Your bloating becomes very bad.  You have a fever or chills, and your symptoms suddenly get worse.  You vomit.  You have bowel movements that are bloody or black.  You have bleeding from your rectum. Summary  Diverticulosis is a condition that develops when small  pouches (diverticula) form in the wall of the large intestine (colon).  You may have a few pouches or many of them.  This condition is most often diagnosed during an exam for other colon problems.  If you have had an infection related to diverticulosis, treatment may include increasing the fiber in your diet, taking supplements, or taking medicines. This information is not intended to replace advice given to you by your health care provider. Make sure you discuss any questions you have with your health care provider. Document Released: 04/05/2004 Document Revised: 05/28/2016 Document Reviewed: 05/28/2016 Elsevier Interactive Patient Education  2017 [place].  Hemorrhoids Hemorrhoids are swollen veins in and around the rectum or anus. There are two types of hemorrhoids:  Internal hemorrhoids. These occur in the veins that are just inside the rectum. They may poke through to the outside and become irritated and painful.  External hemorrhoids. These occur in the veins that are outside of the anus and can be felt as a painful swelling or  hard lump near the anus.  Most hemorrhoids do not cause serious problems, and they can be managed with home treatments such as diet and lifestyle changes. If home treatments do not help your symptoms, procedures can be done to shrink or remove the hemorrhoids. What are the causes? This condition is caused by increased pressure in the anal area. This pressure may result from various things, including:  Constipation.  Straining to have a bowel movement.  Diarrhea.  Pregnancy.  Obesity.  Sitting for long periods of time.  Heavy lifting or other activity that causes you to strain.  Anal sex.  What are the signs or symptoms? Symptoms of this condition include:  Pain.  Anal itching or irritation.  Rectal bleeding.  Leakage of stool (feces).  Anal swelling.  One or more lumps around the anus.  How is this diagnosed? This condition can  often be diagnosed through a visual exam. Other exams or tests may also be done, such as:  Examination of the rectal area with a gloved hand (digital rectal exam).  Examination of the anal canal using a small tube (anoscope).  A blood test, if you have lost a significant amount of blood.  A test to look inside the colon (sigmoidoscopy or colonoscopy).  How is this treated? This condition can usually be treated at home. However, various procedures may be done if dietary changes, lifestyle changes, and other home treatments do not help your symptoms. These procedures can help make the hemorrhoids smaller or remove them completely. Some of these procedures involve surgery, and others do not. Common procedures include:  Rubber band ligation. Rubber bands are placed at the base of the hemorrhoids to cut off the blood supply to them.  Sclerotherapy. Medicine is injected into the hemorrhoids to shrink them.  Infrared coagulation. A type of light energy is used to get rid of the hemorrhoids.  Hemorrhoidectomy surgery. The hemorrhoids are surgically removed, and the veins that supply them are tied off.  Stapled hemorrhoidopexy surgery. A circular stapling device is used to remove the hemorrhoids and use staples to cut off the blood supply to them.  Follow these instructions at home: Eating and drinking  Eat foods that have a lot of fiber in them, such as whole grains, beans, nuts, fruits, and vegetables. Ask your health care provider about taking products that have added fiber (fiber supplements).  Drink enough fluid to keep your urine clear or pale yellow. Managing pain and swelling  Take warm sitz baths for 20 minutes, 3-4 times a day to ease pain and discomfort.  If directed, apply ice to the affected area. Using ice packs between sitz baths may be helpful. ? Put ice in a plastic bag. ? Place a towel between your skin and the bag. ? Leave the ice on for 20 minutes, 2-3 times a  day. General instructions  Take over-the-counter and prescription medicines only as told by your health care provider.  Use medicated creams or suppositories as told.  Exercise regularly.  Go to the bathroom when you have the urge to have a bowel movement. Do not wait.  Avoid straining to have bowel movements.  Keep the anal area dry and clean. Use wet toilet paper or moist towelettes after a bowel movement.  Do not sit on the toilet for long periods of time. This increases blood pooling and pain. Contact a health care provider if:  You have increasing pain and swelling that are not controlled by treatment or medicine.  You have uncontrolled bleeding.  You have difficulty having a bowel movement, or you are unable to have a bowel movement.  You have pain or inflammation outside the area of the hemorrhoids. This information is not intended to replace advice given to you by your health care provider. Make sure you discuss any questions you have with your health care provider. Document Released: 07/06/2000 Document Revised: 12/07/2015 Document Reviewed: 03/23/2015 Elsevier Interactive Patient Education  [employer].

## 2018-01-31 NOTE — Op Note (Signed)
[facility] Patient Name: Albert Garner Procedure Date: 01/31/2018 7:05 AM MRN: [id number] Date of Birth: [birth year] Attending MD: Norvel Richards , MD CSN: [id number] Age: 77 Admit Type: Outpatient Procedure:                Colonoscopy Indications:              High risk colon cancer surveillance: Personal                            history of colonic polyps Providers:                Norvel Richards, MD, Charlsie Quest. Theda Sers RN, RN,                            Aram Candela Referring MD:              Medicines:                Midazolam 2 mg IV, Meperidine 25 mg IV Complications:            No immediate complications. Estimated Blood Loss:     Estimated blood loss: none. Procedure:                Pre-Anesthesia Assessment:                           - Prior to the procedure, a History and Physical                            was performed, and patient medications and                            allergies were reviewed. The patient's tolerance of                            previous anesthesia was also reviewed. The risks                            and benefits of the procedure and the sedation                            options and risks were discussed with the patient.                            All questions were answered, and informed consent                            was obtained. Prior Anticoagulants: The patient has                            taken no previous anticoagulant or antiplatelet                            agents. ASA Grade Assessment: II - A patient with  mild systemic disease. After reviewing the risks                            and benefits, the patient was deemed in                            satisfactory condition to undergo the procedure.                           After obtaining informed consent, the colonoscope                            was passed under direct vision. Throughout the                            procedure, the  patient's blood pressure, pulse, and                            oxygen saturations were monitored continuously. The                            CF-[id number] ([id number]) scope was introduced through                            the anus and advanced to the the cecum, identified                            by appendiceal orifice and ileocecal valve. The                            colonoscopy was performed without difficulty. The                            patient tolerated the procedure well. The quality                            of the bowel preparation was adequate. The entire                            colon was well visualized. Scope In: 7:40:23 AM Scope Out: 7:50:31 AM Scope Withdrawal Time: 0 hours 6 minutes 26 seconds  Total Procedure Duration: 0 hours 10 minutes 8 seconds  Findings:      The perianal and digital rectal examinations were normal.      Multiple medium-mouthed diverticula were found in the sigmoid colon and       descending colon.      Non-bleeding internal hemorrhoids were found during retroflexion. The       hemorrhoids were moderate, medium-sized and Grade I (internal       hemorrhoids that do not prolapse).      The exam was otherwise without abnormality on direct and retroflexion       views. Impression:               - Diverticulosis in the sigmoid colon and in the  descending colon.                           - Non-bleeding internal hemorrhoids.                           - The examination was otherwise normal on direct                            and retroflexion views.                           - No specimens collected. Moderate Sedation:      Moderate (conscious) sedation was personally administered by an       anesthesia professional. The following parameters were monitored: oxygen       saturation, heart rate, blood pressure, respiratory rate, EKG, adequacy       of pulmonary ventilation, and response to care. Total physician        intraservice time was 12 minutes. Recommendation:           - Patient has a contact number available for                            emergencies. The signs and symptoms of potential                            delayed complications were discussed with the                            patient. Return to normal activities tomorrow.                            Written discharge instructions were provided to the                            patient.                           - Resume previous diet.                           - no repeat colonoscopy unless new symptoms                            develop..                           - Return to GI office PRN. Procedure Code(s):        --- Professional ---                           [phone], Colonoscopy, flexible; diagnostic, including                            collection of specimen(s) by brushing or washing,  when performed (separate procedure) Diagnosis Code(s):        --- Professional ---                           Z86.010, Personal history of colonic polyps                           K64.0, First degree hemorrhoids                           K57.30, Diverticulosis of large intestine without                            perforation or abscess without bleeding CPT copyright 2017 American Medical Association. All rights reserved. The codes documented in this report are preliminary and upon coder review may  be revised to meet current compliance requirements. Cristopher Estimable. Kayleb Warshaw, MD Norvel Richards, MD 01/31/2018 7:58:37 AM This report has been signed electronically. Number of Addenda: 0

## 2018-01-31 NOTE — H&P (Signed)
@LOGO @   Primary Care Physician:  [name], MD Primary Gastroenterologist:  Dr. Gala Romney  Pre-Procedure History & Physical: HPI:  Albert Garner is a 77 y.o. male here for surveillance colonoscopy. History of colonic adenoma.  Past Medical History:  Diagnosis Date  . Allergy    occasional seasonal allergies  . Carotid artery occlusion   . Hypercholesteremia   . Hyperlipidemia   . Hypertension     Past Surgical History:  Procedure Laterality Date  . COLONOSCOPY     2003 w/santagade-no report in epic  . COLONOSCOPY  01/2015   Dr. Ardis Hughs: four polyps removed, 3 tubular adenomas. next tcs 3 years  . ESOPHAGOGASTRODUODENOSCOPY N/A 03/09/2017   Dr. Gala Romney: Distal esophagus quite macerated, Schatzki ring not dilated, retained food in the hernia sac.  Marland Kitchen ESOPHAGOGASTRODUODENOSCOPY N/A 04/11/2017   Dr. Gala Romney: low grade of narrowing Schatzki ring s/p dilation  . [place]   left hand 3 rd digit  . FOREIGN BODY REMOVAL N/A 03/09/2017   Procedure: FOREIGN BODY REMOVAL;  Surgeon: Daneil Dolin, MD;  Location: [facility];  Service: Endoscopy;  Laterality: N/A;  . INGUINAL HERNIA REPAIR Right 1986  . MALONEY DILATION N/A 04/11/2017   Procedure: [name] DILATION;  Surgeon: Daneil Dolin, MD;  Location: [facility];  Service: Endoscopy;  Laterality: N/A;  . MOUTH SURGERY     with sedation-bottom teeth pulled    Prior to Admission medications   Medication Sig Start Date End Date Taking? Authorizing Provider  aspirin 81 MG tablet Take 81 mg by mouth daily.   Yes [provider]  atorvastatin (LIPITOR) 40 MG tablet TAKE 1 TABLET BY MOUTH ONCE DAILY 09/17/17  Yes [name], MD  lisinopril (PRINIVIL,ZESTRIL) 10 MG tablet TAKE ONE TABLET BY MOUTH ONCE DAILY 02/05/17  Yes [name], MD  Nutritional Supplements (ENSURE ORIGINAL PO) Take 1 Bottle by mouth See admin instructions. Drinks 1-3 daily   Yes [provider]  traZODone (DESYREL) 50 MG  tablet TAKE TWO TABLETS BY MOUTH AT BEDTIME 06/10/17  Yes [name], MD    Allergies as of 10/09/2017  . (No Known Allergies)    Family History  Problem Relation Age of Onset  . Heart disease Mother   . Throat cancer Brother   . Colon cancer Neg Hx   . Rectal cancer Neg Hx   . Stomach cancer Neg Hx     Social History   Socioeconomic History  . Marital status: Married    Spouse name: Not on file  . Number of children: Not on file  . Years of education: Not on file  . Highest education level: Not on file  Occupational History  . Not on file  Social Needs  . Financial resource strain: Not on file  . Food insecurity:    Worry: Not on file    Inability: Not on file  . Transportation needs:    Medical: Not on file    Non-medical: Not on file  Tobacco Use  . Smoking status: Never Smoker  . Smokeless tobacco: Current User    Types: Chew  Substance and Sexual Activity  . Alcohol use: No  . Drug use: No  . Sexual activity: Yes  Lifestyle  . Physical activity:    Days per week: Not on file    Minutes per session: Not on file  . Stress: Not on file  Relationships  . Social connections:    Talks on  phone: Not on file    Gets together: Not on file    Attends religious service: Not on file    Active member of club or organization: Not on file    Attends meetings of clubs or organizations: Not on file    Relationship status: Not on file  . Intimate partner violence:    Fear of current or ex partner: Not on file    Emotionally abused: Not on file    Physically abused: Not on file    Forced sexual activity: Not on file  Other Topics Concern  . Not on file  Social History Narrative  . Not on file    Review of Systems: See HPI, otherwise negative ROS  Physical Exam: BP (!) 151/67   Pulse 63   Temp 97.7 F (36.5 C) (Oral)   Resp 18   Ht 6\' 1"  (1.854 m)   Wt 155 lb (70.3 kg)   SpO2 100%   BMI 20.45 kg/m  General:   Alert,  Well-developed,  well-nourished, pleasant and cooperative in NAD Neck:  Supple; no masses or thyromegaly. No significant cervical adenopathy. Lungs:  Clear throughout to auscultation.   No wheezes, crackles, or rhonchi. No acute distress. Heart:  Regular rate and rhythm; no murmurs, clicks, rubs,  or gallops. Abdomen: Non-distended, normal bowel sounds.  Soft and nontender without appreciable mass or hepatosplenomegaly.  Pulses:  Normal pulses noted. Extremities:  Without clubbing or edema.  Impression/Plan: 77 year old gentleman here for surveillance colonoscopy.  The risks, benefits, limitations, alternatives and imponderables have been reviewed with the patient. Questions have been answered. All parties are agreeable.      Notice: This dictation was prepared with Dragon dictation along with smaller phrase technology. Any transcriptional errors that result from this process are unintentional and may not be corrected upon review.

## 2018-02-03 ENCOUNTER — Other Ambulatory Visit: Payer: Self-pay | Admitting: Family Medicine

## 2018-02-03 DIAGNOSIS — I10 Essential (primary) hypertension: Secondary | ICD-10-CM

## 2018-02-05 ENCOUNTER — Encounter (HOSPITAL_COMMUNITY): Payer: Self-pay | Admitting: Internal Medicine

## 2018-02-11 DIAGNOSIS — H2513 Age-related nuclear cataract, bilateral: Secondary | ICD-10-CM | POA: Diagnosis not present

## 2018-02-11 DIAGNOSIS — H11002 Unspecified pterygium of left eye: Secondary | ICD-10-CM | POA: Diagnosis not present

## 2018-02-11 DIAGNOSIS — H524 Presbyopia: Secondary | ICD-10-CM | POA: Diagnosis not present

## 2018-02-26 ENCOUNTER — Other Ambulatory Visit: Payer: Self-pay

## 2018-02-26 DIAGNOSIS — D649 Anemia, unspecified: Secondary | ICD-10-CM

## 2018-03-12 DIAGNOSIS — H2512 Age-related nuclear cataract, left eye: Secondary | ICD-10-CM | POA: Diagnosis not present

## 2018-03-12 DIAGNOSIS — H2511 Age-related nuclear cataract, right eye: Secondary | ICD-10-CM | POA: Diagnosis not present

## 2018-03-19 DIAGNOSIS — H2512 Age-related nuclear cataract, left eye: Secondary | ICD-10-CM | POA: Diagnosis not present

## 2018-04-07 ENCOUNTER — Other Ambulatory Visit: Payer: Self-pay | Admitting: Family Medicine

## 2018-04-11 ENCOUNTER — Telehealth: Payer: Self-pay

## 2018-04-11 NOTE — Telephone Encounter (Signed)
Bethena Roys called requesting refill on patient atorvastatin 40 mg. Upon checking I saw where the rx was filled on 9/16. Bethena Roys stated she had gone to the pharmacy and they told her  No refill request had been made.  I then called the [place] and they  confirmed the medication was ready and had been ready for 5 days. Bethena Roys is aware and will go and pick up today

## 2018-04-28 ENCOUNTER — Other Ambulatory Visit: Payer: Self-pay | Admitting: *Deleted

## 2018-04-28 ENCOUNTER — Encounter: Payer: Self-pay | Admitting: *Deleted

## 2018-04-28 DIAGNOSIS — D649 Anemia, unspecified: Secondary | ICD-10-CM

## 2018-05-22 ENCOUNTER — Ambulatory Visit (INDEPENDENT_AMBULATORY_CARE_PROVIDER_SITE_OTHER): Payer: Medicare Other | Admitting: Family Medicine

## 2018-05-22 ENCOUNTER — Encounter: Payer: Self-pay | Admitting: Family Medicine

## 2018-05-22 ENCOUNTER — Other Ambulatory Visit: Payer: Self-pay

## 2018-05-22 VITALS — BP 138/70 | HR 62 | Ht 73.0 in | Wt 160.0 lb

## 2018-05-22 DIAGNOSIS — E7800 Pure hypercholesterolemia, unspecified: Secondary | ICD-10-CM

## 2018-05-22 DIAGNOSIS — Z0000 Encounter for general adult medical examination without abnormal findings: Secondary | ICD-10-CM

## 2018-05-22 DIAGNOSIS — Z125 Encounter for screening for malignant neoplasm of prostate: Secondary | ICD-10-CM | POA: Diagnosis not present

## 2018-05-22 DIAGNOSIS — I2510 Atherosclerotic heart disease of native coronary artery without angina pectoris: Secondary | ICD-10-CM

## 2018-05-22 DIAGNOSIS — I10 Essential (primary) hypertension: Secondary | ICD-10-CM | POA: Diagnosis not present

## 2018-05-22 NOTE — Progress Notes (Signed)
Subjective:    Patient ID: Albert Garner, male    DOB: 27-Oct-1940, 77 y.o.   MRN: [id number]  HPI  Patient is a very pleasant 77 year old Caucasian male here today for complete physical exam.  He is due for a flu shot, shingles vaccine, tetanus shot, and Pneumovax 23.  He has had Prevnar 13.  He refuses all of these vaccinations.  His last colonoscopy was in July of this year.  Therefore he does not require any further colon cancer screening.  He is due for prostate cancer screening although he is asymptomatic.  Past medical history is significant for a 1 to 39% right internal carotid artery stenosis.  He has been cleared by vascular surgery to have this checked again in 2020.  Otherwise he is doing well with no concerns Past Medical History:  Diagnosis Date  . Allergy    occasional seasonal allergies  . Carotid artery occlusion   . Hypercholesteremia   . Hyperlipidemia   . Hypertension    Past Surgical History:  Procedure Laterality Date  . COLONOSCOPY     2003 w/santagade-no report in epic  . COLONOSCOPY  01/2015   Dr. Ardis Hughs: four polyps removed, 3 tubular adenomas. next tcs 3 years  . COLONOSCOPY N/A 01/31/2018   Procedure: COLONOSCOPY;  Surgeon: Daneil Dolin, MD;  [facility];  Service: Endoscopy;  Laterality: N/A;  9:30am  . ESOPHAGOGASTRODUODENOSCOPY N/A 03/09/2017   Dr. Gala Romney: Distal esophagus quite macerated, Schatzki ring not dilated, retained food in the hernia sac.  Marland Kitchen ESOPHAGOGASTRODUODENOSCOPY N/A 04/11/2017   Dr. Gala Romney: low grade of narrowing Schatzki ring s/p dilation  . [place]   left hand 3 rd digit  . FOREIGN BODY REMOVAL N/A 03/09/2017   Procedure: FOREIGN BODY REMOVAL;  Surgeon: Daneil Dolin, MD;  [facility];  Service: Endoscopy;  Laterality: N/A;  . INGUINAL HERNIA REPAIR Right 1986  . MALONEY DILATION N/A 04/11/2017   Procedure: [name] DILATION;  Surgeon: Daneil Dolin, MD;  [facility];  Service:  Endoscopy;  Laterality: N/A;  . MOUTH SURGERY     with sedation-bottom teeth pulled   Current Outpatient Medications on File Prior to Visit  Medication Sig Dispense Refill  . aspirin 81 MG tablet Take 81 mg by mouth daily.    Marland Kitchen atorvastatin (LIPITOR) 40 MG tablet TAKE 1 TABLET BY MOUTH ONCE DAILY 90 tablet 1  . lisinopril (PRINIVIL,ZESTRIL) 10 MG tablet TAKE 1 TABLET BY MOUTH ONCE DAILY 90 tablet 3  . Nutritional Supplements (ENSURE ORIGINAL PO) Take 1 Bottle by mouth See admin instructions. Drinks 1-3 daily    . traZODone (DESYREL) 50 MG tablet TAKE TWO TABLETS BY MOUTH AT BEDTIME 60 tablet 11   No current facility-administered medications on file prior to visit.    No Known Allergies Social History   Socioeconomic History  . Marital status: Married    Spouse name: Not on file  . Number of children: Not on file  . Years of education: Not on file  . Highest education level: Not on file  Occupational History  . Not on file  Social Needs  . Financial resource strain: Not on file  . Food insecurity:    Worry: Not on file    Inability: Not on file  . Transportation needs:    Medical: Not on file    Non-medical: Not on file  Tobacco Use  . Smoking status: Never Smoker  . Smokeless tobacco: Current  User    Types: Chew  Substance and Sexual Activity  . Alcohol use: No  . Drug use: No  . Sexual activity: Yes  Lifestyle  . Physical activity:    Days per week: Not on file    Minutes per session: Not on file  . Stress: Not on file  Relationships  . Social connections:    Talks on phone: Not on file    Gets together: Not on file    Attends religious service: Not on file    Active member of club or organization: Not on file    Attends meetings of clubs or organizations: Not on file    Relationship status: Not on file  . Intimate partner violence:    Fear of current or ex partner: Not on file    Emotionally abused: Not on file    Physically abused: Not on file    Forced  sexual activity: Not on file  Other Topics Concern  . Not on file  Social History Narrative  . Not on file   Family History  Problem Relation Age of Onset  . Heart disease Mother   . Throat cancer Brother   . Colon cancer Neg Hx   . Rectal cancer Neg Hx   . Stomach cancer Neg Hx       Review of Systems  All other systems reviewed and are negative.      Objective:   Physical Exam  Constitutional: He is oriented to person, place, and time. He appears well-developed and well-nourished. No distress.  HENT:  Head: Normocephalic and atraumatic.  Right Ear: External ear normal.  Left Ear: External ear normal.  Nose: Nose normal.  Mouth/Throat: Oropharynx is clear and moist. No oropharyngeal exudate.  Eyes: Pupils are equal, round, and reactive to light. Conjunctivae and EOM are normal. Right eye exhibits no discharge. Left eye exhibits no discharge. No scleral icterus.  Neck: Normal range of motion. Neck supple. No JVD present. No tracheal deviation present. No thyromegaly present.  Cardiovascular: Normal rate, regular rhythm, normal heart sounds and intact distal pulses. Exam reveals no gallop and no friction rub.  No murmur heard. Pulmonary/Chest: Effort normal and breath sounds normal. No stridor. No respiratory distress. He has no wheezes. He has no rales. He exhibits no tenderness.  Abdominal: Soft. Bowel sounds are normal. He exhibits no distension and no mass. There is no tenderness. There is no rebound and no guarding.  Musculoskeletal: Normal range of motion. He exhibits no edema or tenderness.  Lymphadenopathy:    He has no cervical adenopathy.  Neurological: He is alert and oriented to person, place, and time. He has normal reflexes. No cranial nerve deficit. He exhibits normal muscle tone. Coordination normal.  Skin: Skin is warm. No rash noted. He is not diaphoretic. No erythema. No pallor.  Psychiatric: He has a normal mood and affect. His behavior is normal. Judgment  and thought content normal.  Vitals reviewed.       Assessment & Plan:  Pure hypercholesterolemia - Plan: COMPLETE METABOLIC PANEL WITH GFR, Lipid panel  Prostate cancer screening - Plan: PSA  Benign essential HTN - Plan: CBC with Differential/Platelet, COMPLETE METABOLIC PANEL WITH GFR, Lipid panel  Routine general medical examination at a health care facility  ASCVD (arteriosclerotic cardiovascular disease)  Physical exam today is completely normal.  Blood pressure is excellent.  I will check a fasting lipid panel.  Goal LDL cholesterol is less than 70.  Continue aspirin antiplatelet therapy for  his carotid artery stenosis.  Check a CBC to monitor for anemia or bone marrow abnormalities.  I will check a PSA to complete screening for prostate cancer.  Colon cancer screening is up-to-date.  He denies any problems with his memory.  Fall and depression screens are normal.  Functional assessment is normal.

## 2018-05-23 ENCOUNTER — Telehealth: Payer: Self-pay

## 2018-05-23 LAB — LIPID PANEL
CHOLESTEROL: 122 mg/dL (ref <200)
HDL: 45 mg/dL (ref >40)
LDL CHOLESTEROL (CALC): 62 mg/dL
Non-HDL Cholesterol (Calc): 77 mg/dL (calc) (ref <130)
Total CHOL/HDL Ratio: 2.7 (calc) (ref <5.0)
Triglycerides: 71 mg/dL (ref <150)

## 2018-05-23 LAB — CBC WITH DIFFERENTIAL/PLATELET
BASOS ABS: 9 {cells}/uL (ref 0–200)
Basophils Relative: 0.2 %
EOS ABS: 90 {cells}/uL (ref 15–500)
Eosinophils Relative: 2.1 %
HEMATOCRIT: 36.1 % — AB (ref 38.5–50.0)
Hemoglobin: 12.2 g/dL — ABNORMAL LOW (ref 13.2–17.1)
LYMPHS ABS: 1152 {cells}/uL (ref 850–3900)
MCH: 31.4 pg (ref 27.0–33.0)
MCHC: 33.8 g/dL (ref 32.0–36.0)
MCV: 93 fL (ref 80.0–100.0)
MPV: 10.2 fL (ref 7.5–12.5)
Monocytes Relative: 7.3 %
NEUTROS PCT: 63.6 %
Neutro Abs: 2735 cells/uL (ref 1500–7800)
Platelets: 157 10*3/uL (ref 140–400)
RBC: 3.88 10*6/uL — ABNORMAL LOW (ref 4.20–5.80)
RDW: 12.4 % (ref 11.0–15.0)
Total Lymphocyte: 26.8 %
WBC: 4.3 10*3/uL (ref 3.8–10.8)
WBCMIX: 314 {cells}/uL (ref 200–950)

## 2018-05-23 LAB — COMPLETE METABOLIC PANEL WITH GFR
AG RATIO: 1.6 (calc) (ref 1.0–2.5)
ALBUMIN MSPROF: 3.9 g/dL (ref 3.6–5.1)
ALKALINE PHOSPHATASE (APISO): 88 U/L (ref 40–115)
ALT: 13 U/L (ref 9–46)
AST: 19 U/L (ref 10–35)
BILIRUBIN TOTAL: 0.6 mg/dL (ref 0.2–1.2)
BUN: 18 mg/dL (ref 7–25)
CHLORIDE: 105 mmol/L (ref 98–110)
CO2: 29 mmol/L (ref 20–32)
Calcium: 9.2 mg/dL (ref 8.6–10.3)
Creat: 0.99 mg/dL (ref 0.70–1.18)
GFR, Est African American: 85 mL/min/{1.73_m2} (ref >=60)
GFR, Est Non African American: 73 mL/min/{1.73_m2} (ref >=60)
Globulin: 2.5 g/dL (calc) (ref 1.9–3.7)
Glucose, Bld: 87 mg/dL (ref 65–99)
POTASSIUM: 4.4 mmol/L (ref 3.5–5.3)
Sodium: 143 mmol/L (ref 135–146)
Total Protein: 6.4 g/dL (ref 6.1–8.1)

## 2018-05-23 LAB — PSA: PSA: 0.7 ng/mL (ref <=4.0)

## 2018-05-23 NOTE — Telephone Encounter (Signed)
Informed patient of lab results

## 2018-05-23 NOTE — Telephone Encounter (Signed)
-----   Message from [name], MD sent at 05/23/2018  6:39 AM EDT ----- Labs are outstanding.  No change at the present time

## 2018-06-04 ENCOUNTER — Other Ambulatory Visit: Payer: Self-pay | Admitting: Family Medicine

## 2018-07-21 DIAGNOSIS — Z961 Presence of intraocular lens: Secondary | ICD-10-CM | POA: Diagnosis not present

## 2018-10-03 ENCOUNTER — Other Ambulatory Visit: Payer: Self-pay | Admitting: Family Medicine

## 2018-10-07 ENCOUNTER — Other Ambulatory Visit: Payer: Self-pay | Admitting: Family Medicine

## 2018-10-27 ENCOUNTER — Other Ambulatory Visit: Payer: Self-pay

## 2018-10-27 DIAGNOSIS — I10 Essential (primary) hypertension: Secondary | ICD-10-CM

## 2018-10-27 MED ORDER — LISINOPRIL 10 MG PO TABS: 10.0000 mg | ORAL_TABLET | Freq: Every day | ORAL | 0 refills | Status: DC

## 2018-10-27 MED ORDER — ATORVASTATIN CALCIUM 40 MG PO TABS: 40.0000 mg | ORAL_TABLET | Freq: Every day | ORAL | 0 refills | Status: DC

## 2019-02-04 ENCOUNTER — Other Ambulatory Visit: Payer: Self-pay | Admitting: Family Medicine

## 2019-02-04 DIAGNOSIS — I10 Essential (primary) hypertension: Secondary | ICD-10-CM

## 2019-04-22 ENCOUNTER — Ambulatory Visit: Payer: Medicare Other

## 2019-04-24 ENCOUNTER — Other Ambulatory Visit: Payer: Self-pay | Admitting: Family Medicine

## 2019-04-24 DIAGNOSIS — I10 Essential (primary) hypertension: Secondary | ICD-10-CM

## 2019-04-27 ENCOUNTER — Other Ambulatory Visit: Payer: Self-pay | Admitting: Family Medicine

## 2019-05-22 ENCOUNTER — Other Ambulatory Visit: Payer: Self-pay

## 2019-05-25 ENCOUNTER — Ambulatory Visit (INDEPENDENT_AMBULATORY_CARE_PROVIDER_SITE_OTHER): Payer: Medicare Other | Admitting: Family Medicine

## 2019-05-25 ENCOUNTER — Encounter: Payer: Self-pay | Admitting: Family Medicine

## 2019-05-25 ENCOUNTER — Other Ambulatory Visit: Payer: Self-pay

## 2019-05-25 VITALS — BP 112/60 | HR 58 | Temp 97.3°F | Resp 12 | Ht 73.5 in | Wt 156.0 lb

## 2019-05-25 DIAGNOSIS — I10 Essential (primary) hypertension: Secondary | ICD-10-CM

## 2019-05-25 DIAGNOSIS — Z0000 Encounter for general adult medical examination without abnormal findings: Secondary | ICD-10-CM

## 2019-05-25 DIAGNOSIS — I6521 Occlusion and stenosis of right carotid artery: Secondary | ICD-10-CM

## 2019-05-25 DIAGNOSIS — Z125 Encounter for screening for malignant neoplasm of prostate: Secondary | ICD-10-CM

## 2019-05-25 DIAGNOSIS — E7800 Pure hypercholesterolemia, unspecified: Secondary | ICD-10-CM | POA: Diagnosis not present

## 2019-05-25 DIAGNOSIS — I2510 Atherosclerotic heart disease of native coronary artery without angina pectoris: Secondary | ICD-10-CM | POA: Diagnosis not present

## 2019-05-25 DIAGNOSIS — Z0001 Encounter for general adult medical examination with abnormal findings: Secondary | ICD-10-CM | POA: Diagnosis not present

## 2019-05-25 MED ORDER — ATORVASTATIN CALCIUM 40 MG PO TABS: 40.0000 mg | ORAL_TABLET | Freq: Every day | ORAL | 1 refills | Status: DC

## 2019-05-25 MED ORDER — LISINOPRIL 10 MG PO TABS: 10.0000 mg | ORAL_TABLET | Freq: Every day | ORAL | 3 refills | Status: DC

## 2019-05-25 MED ORDER — TRAZODONE HCL 50 MG PO TABS: 100.0000 mg | ORAL_TABLET | Freq: Every day | ORAL | 2 refills | Status: DC

## 2019-05-25 NOTE — Progress Notes (Signed)
Subjective:    Patient ID: Albert Garner, male    DOB: 1940/08/08, 78 y.o.   MRN: [id number]  HPI  Patient is a very pleasant 78 year old Caucasian male here today for complete physical exam.  He is due for a flu shot, shingles vaccine, tetanus shot, and Pneumovax 23.  He has had Prevnar 13.  He refuses all of these vaccinations.  His last colonoscopy was in July 2019.  Therefore he does not require any further colon cancer screening.  He is due for prostate cancer screening although he is asymptomatic.  Past medical history is significant for a 1 to 39% right internal carotid artery stenosis.  He is due to have this checked again this year.  Otherwise he is doing well with no concerns Past Medical History:  Diagnosis Date  . Allergy    occasional seasonal allergies  . Carotid artery occlusion   . Hypercholesteremia   . Hyperlipidemia   . Hypertension    Past Surgical History:  Procedure Laterality Date  . COLONOSCOPY     2003 w/santagade-no report in epic  . COLONOSCOPY  01/2015   Dr. Ardis Hughs: four polyps removed, 3 tubular adenomas. next tcs 3 years  . COLONOSCOPY N/A 01/31/2018   Procedure: COLONOSCOPY;  Surgeon: Daneil Dolin, MD;  [facility];  Service: Endoscopy;  Laterality: N/A;  9:30am  . ESOPHAGOGASTRODUODENOSCOPY N/A 03/09/2017   Dr. Gala Romney: Distal esophagus quite macerated, Schatzki ring not dilated, retained food in the hernia sac.  Marland Kitchen ESOPHAGOGASTRODUODENOSCOPY N/A 04/11/2017   Dr. Gala Romney: low grade of narrowing Schatzki ring s/p dilation  . [place]   left hand 3 rd digit  . FOREIGN BODY REMOVAL N/A 03/09/2017   Procedure: FOREIGN BODY REMOVAL;  Surgeon: Daneil Dolin, MD;  [facility];  Service: Endoscopy;  Laterality: N/A;  . INGUINAL HERNIA REPAIR Right 1986  . MALONEY DILATION N/A 04/11/2017   Procedure: [name] DILATION;  Surgeon: Daneil Dolin, MD;  [facility];  Service: Endoscopy;  Laterality: N/A;  . MOUTH  SURGERY     with sedation-bottom teeth pulled   Current Outpatient Medications on File Prior to Visit  Medication Sig Dispense Refill  . aspirin 81 MG tablet Take 81 mg by mouth daily.    Marland Kitchen atorvastatin (LIPITOR) 40 MG tablet TAKE 1 TABLET BY MOUTH ONCE DAILY 90 tablet 0  . lisinopril (ZESTRIL) 10 MG tablet TAKE 1 TABLET BY MOUTH ONCE DAILY 90 tablet 0  . Nutritional Supplements (ENSURE ORIGINAL PO) Take 1 Bottle by mouth See admin instructions. Drinks 1-3 daily    . traZODone (DESYREL) 50 MG tablet TAKE 2 TABLETS BY MOUTH AT BEDTIME 60 tablet 0   No current facility-administered medications on file prior to visit.    No Known Allergies Social History   Socioeconomic History  . Marital status: Married    Spouse name: Not on file  . Number of children: Not on file  . Years of education: Not on file  . Highest education level: Not on file  Occupational History  . Not on file  Social Needs  . Financial resource strain: Not on file  . Food insecurity    Worry: Not on file    Inability: Not on file  . Transportation needs    Medical: Not on file    Non-medical: Not on file  Tobacco Use  . Smoking status: Never Smoker  . Smokeless tobacco: Current User    Types: Chew  Substance and Sexual Activity  . Alcohol use: No  . Drug use: No  . Sexual activity: Yes  Lifestyle  . Physical activity    Days per week: Not on file    Minutes per session: Not on file  . Stress: Not on file  Relationships  . Social Herbalist on phone: Not on file    Gets together: Not on file    Attends religious service: Not on file    Active member of club or organization: Not on file    Attends meetings of clubs or organizations: Not on file    Relationship status: Not on file  . Intimate partner violence    Fear of current or ex partner: Not on file    Emotionally abused: Not on file    Physically abused: Not on file    Forced sexual activity: Not on file  Other Topics Concern  .  Not on file  Social History Narrative  . Not on file   Family History  Problem Relation Age of Onset  . Heart disease Mother   . Throat cancer Brother   . Colon cancer Neg Hx   . Rectal cancer Neg Hx   . Stomach cancer Neg Hx       Review of Systems  All other systems reviewed and are negative.      Objective:   Physical Exam  Constitutional: He is oriented to person, place, and time. He appears well-developed and well-nourished. No distress.  HENT:  Head: Normocephalic and atraumatic.  Right Ear: External ear normal.  Left Ear: External ear normal.  Nose: Nose normal.  Mouth/Throat: Oropharynx is clear and moist. No oropharyngeal exudate.  Eyes: Pupils are equal, round, and reactive to light. Conjunctivae and EOM are normal. Right eye exhibits no discharge. Left eye exhibits no discharge. No scleral icterus.  Neck: Normal range of motion. Neck supple. No JVD present. No tracheal deviation present. No thyromegaly present.  Cardiovascular: Normal rate, regular rhythm, normal heart sounds and intact distal pulses. Exam reveals no gallop and no friction rub.  No murmur heard. Pulmonary/Chest: Effort normal and breath sounds normal. No stridor. No respiratory distress. He has no wheezes. He has no rales. He exhibits no tenderness.  Abdominal: Soft. Bowel sounds are normal. He exhibits no distension and no mass. There is no abdominal tenderness. There is no rebound and no guarding.  Musculoskeletal: Normal range of motion.        General: No tenderness or edema.  Lymphadenopathy:    He has no cervical adenopathy.  Neurological: He is alert and oriented to person, place, and time. He has normal reflexes. No cranial nerve deficit. He exhibits normal muscle tone. Coordination normal.  Skin: Skin is warm. No rash noted. He is not diaphoretic. No erythema. No pallor.  Psychiatric: He has a normal mood and affect. His behavior is normal. Judgment and thought content normal.  Vitals  reviewed.       Assessment & Plan:  Routine general medical examination at a health care facility - Plan: PSA  ASCVD (arteriosclerotic cardiovascular disease) - Plan: CBC with Differential/Platelet, COMPLETE METABOLIC PANEL WITH GFR, Lipid panel  Pure hypercholesterolemia - Plan: CBC with Differential/Platelet, COMPLETE METABOLIC PANEL WITH GFR, Lipid panel  Benign essential HTN - Plan: CBC with Differential/Platelet, COMPLETE METABOLIC PANEL WITH GFR, Lipid panel  Stenosis of right carotid artery - Plan: US Carotid Duplex Bilateral, CBC with Differential/Platelet, COMPLETE METABOLIC PANEL WITH GFR, Lipid panel  Prostate cancer screening - Plan: PSA   Physical exam today is completely normal.  Blood pressure is excellent.  I will check a fasting lipid panel.  Goal LDL cholesterol is less than 70.  Continue aspirin antiplatelet therapy for his carotid artery stenosis.  Check a CBC to monitor for anemia or bone marrow abnormalities.  I will check a PSA to complete screening for prostate cancer.  Colon cancer screening is up-to-date.  He denies any problems with his memory.  Fall and depression screens are normal.  Functional assessment is normal. Will schedule for follow up carotid dopplers to reevaluate for carotid artery stenosis.

## 2019-05-25 NOTE — Addendum Note (Signed)
Addended by: Shary Decamp B on: 05/25/2019 09:37 AM   Modules accepted: Orders

## 2019-05-26 LAB — COMPLETE METABOLIC PANEL WITH GFR
AG Ratio: 1.7 (calc) (ref 1.0–2.5)
ALT: 14 U/L (ref 9–46)
AST: 19 U/L (ref 10–35)
Albumin: 4 g/dL (ref 3.6–5.1)
Alkaline phosphatase (APISO): 72 U/L (ref 35–144)
BUN: 15 mg/dL (ref 7–25)
CO2: 26 mmol/L (ref 20–32)
Calcium: 9.5 mg/dL (ref 8.6–10.3)
Chloride: 105 mmol/L (ref 98–110)
Creat: 1.09 mg/dL (ref 0.70–1.18)
GFR, Est African American: 75 mL/min/{1.73_m2} (ref >=60)
GFR, Est Non African American: 65 mL/min/{1.73_m2} (ref >=60)
Globulin: 2.4 g/dL (calc) (ref 1.9–3.7)
Glucose, Bld: 89 mg/dL (ref 65–99)
Potassium: 4.7 mmol/L (ref 3.5–5.3)
Sodium: 142 mmol/L (ref 135–146)
Total Bilirubin: 0.7 mg/dL (ref 0.2–1.2)
Total Protein: 6.4 g/dL (ref 6.1–8.1)

## 2019-05-26 LAB — CBC WITH DIFFERENTIAL/PLATELET
Absolute Monocytes: 320 cells/uL (ref 200–950)
Basophils Absolute: 21 cells/uL (ref 0–200)
Basophils Relative: 0.5 %
Eosinophils Absolute: 98 cells/uL (ref 15–500)
Eosinophils Relative: 2.4 %
HCT: 37.9 % — ABNORMAL LOW (ref 38.5–50.0)
Hemoglobin: 12.9 g/dL — ABNORMAL LOW (ref 13.2–17.1)
Lymphs Abs: 1074 cells/uL (ref 850–3900)
MCH: 31.6 pg (ref 27.0–33.0)
MCHC: 34 g/dL (ref 32.0–36.0)
MCV: 92.9 fL (ref 80.0–100.0)
MPV: 10.5 fL (ref 7.5–12.5)
Monocytes Relative: 7.8 %
Neutro Abs: 2587 cells/uL (ref 1500–7800)
Neutrophils Relative %: 63.1 %
Platelets: 157 10*3/uL (ref 140–400)
RBC: 4.08 10*6/uL — ABNORMAL LOW (ref 4.20–5.80)
RDW: 12.1 % (ref 11.0–15.0)
Total Lymphocyte: 26.2 %
WBC: 4.1 10*3/uL (ref 3.8–10.8)

## 2019-05-26 LAB — PSA: PSA: 0.6 ng/mL (ref <=4.0)

## 2019-05-26 LAB — LIPID PANEL
Cholesterol: 129 mg/dL (ref <200)
HDL: 46 mg/dL (ref >=40)
LDL Cholesterol (Calc): 65 mg/dL (calc)
Non-HDL Cholesterol (Calc): 83 mg/dL (calc) (ref <130)
Total CHOL/HDL Ratio: 2.8 (calc) (ref <5.0)
Triglycerides: 99 mg/dL (ref <150)

## 2019-09-17 DIAGNOSIS — H524 Presbyopia: Secondary | ICD-10-CM | POA: Diagnosis not present

## 2019-09-17 DIAGNOSIS — H52203 Unspecified astigmatism, bilateral: Secondary | ICD-10-CM | POA: Diagnosis not present

## 2019-09-17 DIAGNOSIS — H5203 Hypermetropia, bilateral: Secondary | ICD-10-CM | POA: Diagnosis not present

## 2019-09-17 DIAGNOSIS — Z961 Presence of intraocular lens: Secondary | ICD-10-CM | POA: Diagnosis not present

## 2019-09-21 ENCOUNTER — Other Ambulatory Visit: Payer: Self-pay | Admitting: Family Medicine

## 2019-11-09 ENCOUNTER — Telehealth: Payer: Self-pay | Admitting: Internal Medicine

## 2019-11-09 NOTE — Telephone Encounter (Signed)
Pt's daughter, Lelon Frohlich, called to say patient has OV with RMR on 4/30. Patient has been getting choked (last episode was yesterday). She is asking if we can go ahead and scheduled his EGD. I told her I would see what the nurse would recommend because if he was choking where he can't swallow his saliva or catch his breath he needed to go to the ER or I could see if we have any cancellations for OV before 4/30. Please advise. [phone]

## 2019-11-09 NOTE — Telephone Encounter (Signed)
If they are agreeable to see an APP, we have openings this week.   We can save a date for EGD with RMR with conscious sedation.

## 2019-11-09 NOTE — Telephone Encounter (Signed)
Spoke with pts daughter. Pt hasn't been seen in office in 2 years and has c/o swallowing/chocking issues. Pt is able to get fluids down and knows if anything changes, he should go to the ED or call EMS for possible blockage. Pts daughter wants an EGD scheduled before pt can be seen, please advise.

## 2019-11-09 NOTE — Telephone Encounter (Signed)
Noted. Spoke with pts daughter. Pts apt was moved up to this Thursday with AB.

## 2019-11-12 ENCOUNTER — Ambulatory Visit: Payer: Medicare Other | Admitting: Gastroenterology

## 2019-11-12 ENCOUNTER — Encounter: Payer: Self-pay | Admitting: *Deleted

## 2019-11-12 ENCOUNTER — Other Ambulatory Visit: Payer: Self-pay

## 2019-11-12 ENCOUNTER — Encounter: Payer: Self-pay | Admitting: Gastroenterology

## 2019-11-12 ENCOUNTER — Other Ambulatory Visit (HOSPITAL_COMMUNITY)
Admission: RE | Admit: 2019-11-12 | Discharge: 2019-11-12 | Disposition: A | Discharge status: Home / Self Care | Payer: Medicare Other | Source: Ambulatory Visit | Attending: Internal Medicine | Admitting: Internal Medicine

## 2019-11-12 VITALS — BP 130/59 | HR 67 | Temp 98.2°F | Ht 73.0 in | Wt 154.4 lb

## 2019-11-12 DIAGNOSIS — Z01812 Encounter for preprocedural laboratory examination: Secondary | ICD-10-CM | POA: Diagnosis not present

## 2019-11-12 DIAGNOSIS — R1310 Dysphagia, unspecified: Secondary | ICD-10-CM | POA: Diagnosis not present

## 2019-11-12 DIAGNOSIS — R1319 Other dysphagia: Secondary | ICD-10-CM

## 2019-11-12 DIAGNOSIS — Z20822 Contact with and (suspected) exposure to covid-19: Secondary | ICD-10-CM | POA: Insufficient documentation

## 2019-11-12 MED ORDER — PANTOPRAZOLE SODIUM 40 MG PO TBEC: 40.0000 mg | DELAYED_RELEASE_TABLET | Freq: Every day | ORAL | 3 refills | Status: DC

## 2019-11-12 NOTE — Progress Notes (Signed)
Referring Provider: [name], MD Primary Care Physician:  [name], MD Primary GI: Dr. Gala Romney   Chief Complaint  Patient presents with  . Dysphagia    HPI:   Albert Garner is a 79 y.o. male presenting today with a history of dysphagia, last seen in March 2019. Aug 2018 with food impaction, s/p EGD with macerated distal esophagus and  Schatzki's ring but no dilation. Sept 2018 underwent early interval EGD with dilation of Schatzki ring.  Has been doing well since that time until just after Christmas. Choked on potatoes. Feels food in chest is lodging. Solid food dysphagia. Difficulty with bread on Sunday. Trying to eat small pieces. Has had to regurgitate food at times. Takes multiple swallows. Never eats without drinking liquids. Takes a bit then takes a drink. No nausea. Not on a PPI. No pill dysphagia.   Albert Garner, his daughter, is present with him today.    Past Medical History:  Diagnosis Date  . Allergy    occasional seasonal allergies  . Carotid artery occlusion   . Hypercholesteremia   . Hyperlipidemia   . Hypertension     Past Surgical History:  Procedure Laterality Date  . COLONOSCOPY     20 03 w/santagade-no report in epic  . COLONOSCOPY  01/2015   Dr. Ardis Hughs: four polyps removed, 3 tubular adenomas. next tcs 3 years  . COLONOSCOPY N/A 01/31/2018   diverticulosis, internal hemorrhoids.   . ESOPHAGOGASTRODUODENOSCOPY N/A 03/09/2017   Dr. Gala Romney: Distal esophagus quite macerated, Schatzki ring not dilated, retained food in the hernia sac.  Marland Kitchen ESOPHAGOGASTRODUODENOSCOPY N/A 04/11/2017   Dr. Gala Romney: low grade of narrowing Schatzki ring s/p dilation  . [place]   left hand 3 rd digit  . FOREIGN BODY REMOVAL N/A 03/09/2017   Procedure: FOREIGN BODY REMOVAL;  Surgeon: Daneil Dolin, MD;  [facility];  Service: Endoscopy;  Laterality: N/A;  . INGUINAL HERNIA REPAIR Right 1986  . MALONEY DILATION N/A 04/11/2017   Procedure: [name]  DILATION;  Surgeon: Daneil Dolin, MD;  [facility];  Service: Endoscopy;  Laterality: N/A;  . MOUTH SURGERY     with sedation-bottom teeth pulled    Current Outpatient Medications  Medication Sig Dispense Refill  . aspirin 81 MG tablet Take 81 mg by mouth daily.    Marland Kitchen atorvastatin (LIPITOR) 40 MG tablet Take 1 tablet (40 mg total) by mouth daily. 90 tablet 1  . lisinopril (ZESTRIL) 10 MG tablet Take 1 tablet (10 mg total) by mouth daily. 90 tablet 3  . Nutritional Supplements (ENSURE ORIGINAL PO) Take 1 Bottle by mouth See admin instructions. Drinks 1-3 daily    . traZODone (DESYREL) 50 MG tablet TAKE 2 TABLETS (100 MG TOTAL) BY MOUTH AT BEDTIME 60 tablet 2  . pantoprazole (PROTONIX) 40 MG tablet Take 1 tablet (40 mg total) by mouth daily. 30 minutes before breakfast 90 tablet 3   No current facility-administered medications for this visit.    Allergies as of 11/12/2019  . (No Known Allergies)    Family History  Problem Relation Age of Onset  . Heart disease Mother   . Throat cancer Brother   . Colon cancer Neg Hx   . Rectal cancer Neg Hx   . Stomach cancer Neg Hx     Social History   Socioeconomic History  . Marital status: Married    Spouse name: Not on file  . Number of children: Not on file  .  Years of education: Not on file  . Highest education level: Not on file  Occupational History  . Not on file  Tobacco Use  . Smoking status: Never Smoker  . Smokeless tobacco: Current User    Types: Chew  Substance and Sexual Activity  . Alcohol use: No  . Drug use: No  . Sexual activity: Yes  Other Topics Concern  . Not on file  Social History Narrative  . Not on file   Social Determinants of Health   Financial Resource Strain:   . Difficulty of Paying Living Expenses:   Food Insecurity:   . Worried About Charity fundraiser in the Last Year:   . Arboriculturist in the Last Year:   Transportation Needs:   . Film/video editor (Medical):   Marland Kitchen  Lack of Transportation (Non-Medical):   Physical Activity:   . Days of Exercise per Week:   . Minutes of Exercise per Session:   Stress:   . Feeling of Stress :   Social Connections:   . Frequency of Communication with Friends and Family:   . Frequency of Social Gatherings with Friends and Family:   . Attends Religious Services:   . Active Member of Clubs or Organizations:   . Attends Archivist Meetings:   Marland Kitchen Marital Status:     Review of Systems: Gen: Denies fever, chills, anorexia. Denies fatigue, weakness, weight loss.  CV: Denies chest pain, palpitations, syncope, peripheral edema, and claudication. Resp: Denies dyspnea at rest, cough, wheezing, coughing up blood, and pleurisy. GI: see HPI Derm: Denies rash, itching, dry skin Psych: Denies depression, anxiety, memory loss, confusion. No homicidal or suicidal ideation.  Heme: Denies bruising, bleeding, and enlarged lymph nodes.  Physical Exam: BP (!) 130/59   Pulse 67   Temp 98.2 F (36.8 C) (Temporal)   Ht 6\' 1"  (1.854 m)   Wt 154 lb 6.4 oz (70 kg)   BMI 20.37 kg/m  General:   Alert and oriented. No distress noted. Pleasant and cooperative.  Head:  Normocephalic and atraumatic. Eyes:  Conjuctiva clear without scleral icterus. Mouth:  Mask in place Lungs: clear bilaterally Cardiac: S1 S2 present with soft systolic murmur  Abdomen:  +BS, soft, non-tender and non-distended. No rebound or guarding. No HSM or masses noted. Msk:  Mild kyphosis Extremities:  Without edema. Neurologic:  Alert and  oriented x4 Psych:  Alert and cooperative. Normal mood and affect.  ASSESSMENT: Albert Garner is a very pleasant 79 y.o. male presenting today with history of food impaction in 2018, with last dilation in 2018 and had done well until just after Christmas. Now returning with recurrent solid food dysphagia, regurgitating, and need for repeat dilation. As of note, he stopped PPI at some point in the past. We discussed  resuming this and staying on this indefinitely in light of his history.    PLAN:   Swallowing precautions discussed  Proceed with upper endoscopy/dilation in the near future with Dr. Gala Romney. The risks, benefits, and alternatives have been discussed in detail with patient. They have stated understanding and desire to proceed.   Protonix once daily sent to pharmacy  Further recommendations to follow   Annitta Needs, PhD, ANP-BC [facility]

## 2019-11-12 NOTE — Patient Instructions (Signed)
We are arranging an upper endoscopy with dilation by Dr. Gala Romney in the near future.  I have sent in Protonix to take daily, 30 minutes before breakfast.  In meantime, continue with small bites, chewing well, soft foods or liquids.  It was a pleasure to see you today. I want to create trusting relationships with patients to provide genuine, compassionate, and quality care. I value your feedback. If you receive a survey regarding your visit,  I greatly appreciate you taking time to fill this out.   Annitta Needs, PhD, ANP-BC [facility]

## 2019-11-12 NOTE — H&P (View-Only) (Signed)
Referring Provider: [name], MD Primary Care Physician:  [name], MD Primary GI: Dr. Gala Romney   Chief Complaint  Patient presents with  . Dysphagia    HPI:   Albert Garner is a 79 y.o. male presenting today with a history of dysphagia, last seen in March 2019. Aug 2018 with food impaction, s/p EGD with macerated distal esophagus and  Schatzki's ring but no dilation. Sept 2018 underwent early interval EGD with dilation of Schatzki ring.  Has been doing well since that time until just after Christmas. Choked on potatoes. Feels food in chest is lodging. Solid food dysphagia. Difficulty with bread on Sunday. Trying to eat small pieces. Has had to regurgitate food at times. Takes multiple swallows. Never eats without drinking liquids. Takes a bit then takes a drink. No nausea. Not on a PPI. No pill dysphagia.   Albert Garner, his daughter, is present with him today.    Past Medical History:  Diagnosis Date  . Allergy    occasional seasonal allergies  . Carotid artery occlusion   . Hypercholesteremia   . Hyperlipidemia   . Hypertension     Past Surgical History:  Procedure Laterality Date  . COLONOSCOPY     20 03 w/santagade-no report in epic  . COLONOSCOPY  01/2015   Dr. Ardis Hughs: four polyps removed, 3 tubular adenomas. next tcs 3 years  . COLONOSCOPY N/A 01/31/2018   diverticulosis, internal hemorrhoids.   . ESOPHAGOGASTRODUODENOSCOPY N/A 03/09/2017   Dr. Gala Romney: Distal esophagus quite macerated, Schatzki ring not dilated, retained food in the hernia sac.  Marland Kitchen ESOPHAGOGASTRODUODENOSCOPY N/A 04/11/2017   Dr. Gala Romney: low grade of narrowing Schatzki ring s/p dilation  . [place]   left hand 3 rd digit  . FOREIGN BODY REMOVAL N/A 03/09/2017   Procedure: FOREIGN BODY REMOVAL;  Surgeon: Daneil Dolin, MD;  Location: [facility];  Service: Endoscopy;  Laterality: N/A;  . INGUINAL HERNIA REPAIR Right 1986  . MALONEY DILATION N/A 04/11/2017   Procedure: [name]  DILATION;  Surgeon: Daneil Dolin, MD;  Location: [facility];  Service: Endoscopy;  Laterality: N/A;  . MOUTH SURGERY     with sedation-bottom teeth pulled    Current Outpatient Medications  Medication Sig Dispense Refill  . aspirin 81 MG tablet Take 81 mg by mouth daily.    Marland Kitchen atorvastatin (LIPITOR) 40 MG tablet Take 1 tablet (40 mg total) by mouth daily. 90 tablet 1  . lisinopril (ZESTRIL) 10 MG tablet Take 1 tablet (10 mg total) by mouth daily. 90 tablet 3  . Nutritional Supplements (ENSURE ORIGINAL PO) Take 1 Bottle by mouth See admin instructions. Drinks 1-3 daily    . traZODone (DESYREL) 50 MG tablet TAKE 2 TABLETS (100 MG TOTAL) BY MOUTH AT BEDTIME 60 tablet 2  . pantoprazole (PROTONIX) 40 MG tablet Take 1 tablet (40 mg total) by mouth daily. 30 minutes before breakfast 90 tablet 3   No current facility-administered medications for this visit.    Allergies as of 11/12/2019  . (No Known Allergies)    Family History  Problem Relation Age of Onset  . Heart disease Mother   . Throat cancer Brother   . Colon cancer Neg Hx   . Rectal cancer Neg Hx   . Stomach cancer Neg Hx     Social History   Socioeconomic History  . Marital status: Married    Spouse name: Not on file  . Number of children: Not on file  .  Years of education: Not on file  . Highest education level: Not on file  Occupational History  . Not on file  Tobacco Use  . Smoking status: Never Smoker  . Smokeless tobacco: Current User    Types: Chew  Substance and Sexual Activity  . Alcohol use: No  . Drug use: No  . Sexual activity: Yes  Other Topics Concern  . Not on file  Social History Narrative  . Not on file   Social Determinants of Health   Financial Resource Strain:   . Difficulty of Paying Living Expenses:   Food Insecurity:   . Worried About Charity fundraiser in the Last Year:   . Arboriculturist in the Last Year:   Transportation Needs:   . Film/video editor (Medical):   Marland Kitchen  Lack of Transportation (Non-Medical):   Physical Activity:   . Days of Exercise per Week:   . Minutes of Exercise per Session:   Stress:   . Feeling of Stress :   Social Connections:   . Frequency of Communication with Friends and Family:   . Frequency of Social Gatherings with Friends and Family:   . Attends Religious Services:   . Active Member of Clubs or Organizations:   . Attends Archivist Meetings:   Marland Kitchen Marital Status:     Review of Systems: Gen: Denies fever, chills, anorexia. Denies fatigue, weakness, weight loss.  CV: Denies chest pain, palpitations, syncope, peripheral edema, and claudication. Resp: Denies dyspnea at rest, cough, wheezing, coughing up blood, and pleurisy. GI: see HPI Derm: Denies rash, itching, dry skin Psych: Denies depression, anxiety, memory loss, confusion. No homicidal or suicidal ideation.  Heme: Denies bruising, bleeding, and enlarged lymph nodes.  Physical Exam: BP (!) 130/59   Pulse 67   Temp 98.2 F (36.8 C) (Temporal)   Ht 6\' 1"  (1.854 m)   Wt 154 lb 6.4 oz (70 kg)   BMI 20.37 kg/m  General:   Alert and oriented. No distress noted. Pleasant and cooperative.  Head:  Normocephalic and atraumatic. Eyes:  Conjuctiva clear without scleral icterus. Mouth:  Mask in place Lungs: clear bilaterally Cardiac: S1 S2 present with soft systolic murmur  Abdomen:  +BS, soft, non-tender and non-distended. No rebound or guarding. No HSM or masses noted. Msk:  Mild kyphosis Extremities:  Without edema. Neurologic:  Alert and  oriented x4 Psych:  Alert and cooperative. Normal mood and affect.  ASSESSMENT: Albert Garner is a very pleasant 79 y.o. male presenting today with history of food impaction in 2018, with last dilation in 2018 and had done well until just after Christmas. Now returning with recurrent solid food dysphagia, regurgitating, and need for repeat dilation. As of note, he stopped PPI at some point in the past. We discussed  resuming this and staying on this indefinitely in light of his history.    PLAN:   Swallowing precautions discussed  Proceed with upper endoscopy/dilation in the near future with Dr. Gala Romney. The risks, benefits, and alternatives have been discussed in detail with patient. They have stated understanding and desire to proceed.   Protonix once daily sent to pharmacy  Further recommendations to follow   Annitta Needs, PhD, ANP-BC [facility]

## 2019-11-13 ENCOUNTER — Ambulatory Visit (HOSPITAL_COMMUNITY)
Admission: RE | Admit: 2019-11-13 | Discharge: 2019-11-13 | Disposition: A | Discharge status: Home / Self Care | Payer: Medicare Other | Source: Ambulatory Visit | Attending: Internal Medicine | Admitting: Internal Medicine

## 2019-11-13 ENCOUNTER — Encounter (HOSPITAL_COMMUNITY)
Admission: RE | Disposition: A | Discharge status: Home / Self Care | Payer: Self-pay | Source: Ambulatory Visit | Attending: Internal Medicine

## 2019-11-13 ENCOUNTER — Other Ambulatory Visit: Payer: Self-pay

## 2019-11-13 DIAGNOSIS — E7800 Pure hypercholesterolemia, unspecified: Secondary | ICD-10-CM | POA: Insufficient documentation

## 2019-11-13 DIAGNOSIS — Z79899 Other long term (current) drug therapy: Secondary | ICD-10-CM | POA: Diagnosis not present

## 2019-11-13 DIAGNOSIS — E785 Hyperlipidemia, unspecified: Secondary | ICD-10-CM | POA: Diagnosis not present

## 2019-11-13 DIAGNOSIS — Z7982 Long term (current) use of aspirin: Secondary | ICD-10-CM | POA: Insufficient documentation

## 2019-11-13 DIAGNOSIS — R1310 Dysphagia, unspecified: Secondary | ICD-10-CM

## 2019-11-13 DIAGNOSIS — I10 Essential (primary) hypertension: Secondary | ICD-10-CM | POA: Insufficient documentation

## 2019-11-13 DIAGNOSIS — R1013 Epigastric pain: Secondary | ICD-10-CM | POA: Insufficient documentation

## 2019-11-13 DIAGNOSIS — K222 Esophageal obstruction: Secondary | ICD-10-CM

## 2019-11-13 HISTORY — PX: ESOPHAGOGASTRODUODENOSCOPY: SHX5428

## 2019-11-13 HISTORY — PX: MALONEY DILATION: SHX5535

## 2019-11-13 LAB — SARS CORONAVIRUS 2 (TAT 6-24 HRS): SARS Coronavirus 2: NEGATIVE

## 2019-11-13 SURGERY — EGD (ESOPHAGOGASTRODUODENOSCOPY)
Anesthesia: Moderate Sedation

## 2019-11-13 MED ORDER — MIDAZOLAM HCL 5 MG/5ML IJ SOLN
INTRAMUSCULAR | Status: DC | PRN
  Administered 2019-11-13 (×3): 1 mg via INTRAVENOUS

## 2019-11-13 MED ORDER — SODIUM CHLORIDE 0.9 % IV SOLN: INTRAVENOUS | Status: DC

## 2019-11-13 MED ORDER — MEPERIDINE HCL 100 MG/ML IJ SOLN
INTRAMUSCULAR | Status: DC | PRN
  Administered 2019-11-13: 15 mg via INTRAVENOUS
  Administered 2019-11-13: 25 mg via INTRAVENOUS

## 2019-11-13 MED ORDER — ONDANSETRON HCL 4 MG/2ML IJ SOLN
INTRAMUSCULAR | Status: DC | PRN
  Administered 2019-11-13: 4 mg via INTRAVENOUS

## 2019-11-13 MED ORDER — LIDOCAINE VISCOUS HCL 2 % MT SOLN
OROMUCOSAL | Status: DC | PRN
  Administered 2019-11-13: 4 mL via OROMUCOSAL

## 2019-11-13 MED ORDER — STERILE WATER FOR IRRIGATION IR SOLN
Status: DC | PRN
  Administered 2019-11-13: 1.5 mL

## 2019-11-13 MED ORDER — MEPERIDINE HCL 50 MG/ML IJ SOLN
INTRAMUSCULAR | Status: AC
  Filled 2019-11-13: qty 1

## 2019-11-13 MED ORDER — ONDANSETRON HCL 4 MG/2ML IJ SOLN
INTRAMUSCULAR | Status: AC
  Filled 2019-11-13: qty 2

## 2019-11-13 MED ORDER — MIDAZOLAM HCL 5 MG/5ML IJ SOLN
INTRAMUSCULAR | Status: AC
  Filled 2019-11-13: qty 10

## 2019-11-13 MED ORDER — LIDOCAINE VISCOUS HCL 2 % MT SOLN
OROMUCOSAL | Status: AC
  Filled 2019-11-13: qty 15

## 2019-11-13 NOTE — Op Note (Signed)
[facility] Patient Name: Albert Garner Procedure Date: 11/13/2019 12:16 PM MRN: [id number] Date of Birth: Jan 25, 1941 Attending MD: Norvel Richards , MD CSN: [id number] Age: 79 Admit Type: Outpatient Procedure:                Upper GI endoscopy Indications:              Dysphagia Providers:                Norvel Richards, MD, Jeanann Lewandowsky. Sharon Seller, RN,                            Nelma Rothman, Technician Referring MD:              Medicines:                Midazolam 3 mg IV, Meperidine 40 mg IV Complications:            No immediate complications. Estimated Blood Loss:     Estimated blood loss was minimal. Procedure:                Pre-Anesthesia Assessment:                           - Prior to the procedure, a History and Physical                            was performed, and patient medications and                            allergies were reviewed. The patient's tolerance of                            previous anesthesia was also reviewed. The risks                            and benefits of the procedure and the sedation                            options and risks were discussed with the patient.                            All questions were answered, and informed consent                            was obtained. Prior Anticoagulants: The patient has                            taken no previous anticoagulant or antiplatelet                            agents. ASA Grade Assessment: III - A patient with                            severe systemic disease. After reviewing the risks  and benefits, the patient was deemed in                            satisfactory condition to undergo the procedure.                           After obtaining informed consent, the endoscope was                            passed under direct vision. Throughout the                            procedure, the patient's blood pressure, pulse, and                             oxygen saturations were monitored continuously. The                            [id number]) was introduced through the                            mouth, and advanced to the second part of duodenum.                            The upper GI endoscopy was accomplished without                            difficulty. The patient tolerated the procedure                            well. Scope In: 12:27:12 PM Scope Out: 12:32:32 PM Total Procedure Duration: 0 hours 5 minutes 20 seconds  Findings:      A moderate Schatzki ring was found at the gastroesophageal junction. It       had a muscular component. Otherwise, esophagus appeared normal. .      The entire examined stomach was normal.      The duodenal bulb and second portion of the duodenum were normal. The       scope was withdrawn. Dilation was performed with a Maloney dilator with       mild resistance at 56 Fr. The dilation site was examined following       endoscope reinsertion and showed moderate improvement in luminal       narrowing. Estimated blood loss was minimal Impression:               - Moderate Schatzki ring. Dilated.                           - Normal stomach.                           - Normal duodenal bulb and second portion of the                            duodenum.                           -  No specimens collected. Moderate Sedation:      Moderate (conscious) sedation was administered by the endoscopy nurse       and supervised by the endoscopist. The following parameters were       monitored: oxygen saturation, heart rate, blood pressure, respiratory       rate, EKG, adequacy of pulmonary ventilation, and response to care.       Total physician intraservice time was 12 minutes. Recommendation:           - Patient has a contact number available for                            emergencies. The signs and symptoms of potential                            delayed complications were discussed with the                             patient. Return to normal activities tomorrow.                            Written discharge instructions were provided to the                            patient.                           - Resume previous diet.                           - Continue present medications. Continue Protonix                            40 mg daily indefinitely. Patient is to take it                            whether he feels that he needs it or not.                           - Return to my office in 1 year. Procedure Code(s):        --- Professional ---                           [phone], Esophagogastroduodenoscopy, flexible,                            transoral; diagnostic, including collection of                            specimen(s) by brushing or washing, when performed                            (separate procedure)                           [id number], Dilation of esophagus, by unguided sound or  bougie, single or multiple passes                           G0500, Moderate sedation services provided by the                            same physician or other qualified health care                            professional performing a gastrointestinal                            endoscopic service that sedation supports,                            requiring the presence of an independent trained                            observer to assist in the monitoring of the                            patient's level of consciousness and physiological                            status; initial 15 minutes of intra-service time;                            patient age 8 years or older (additional time may                            be reported with [phone], as appropriate) Diagnosis Code(s):        --- Professional ---                           K22.2, Esophageal obstruction                           R13.10, Dysphagia, unspecified CPT copyright 2019 [employer]. All rights  reserved. The codes documented in this report are preliminary and upon coder review may  be revised to meet current compliance requirements. Albert Garner. Albert Corney, MD Norvel Richards, MD 11/13/2019 12:48:24 PM This report has been signed electronically. Number of Addenda: 0

## 2019-11-13 NOTE — Discharge Instructions (Signed)
EGD Discharge instructions Please read the instructions outlined below and refer to this sheet in the next few weeks. These discharge instructions provide you with general information on caring for yourself after you leave the hospital. Your doctor may also give you specific instructions. While your treatment has been planned according to the most current medical practices available, unavoidable complications occasionally occur. If you have any problems or questions after discharge, please call your doctor. ACTIVITY  You may resume your regular activity but move at a slower pace for the next 24 hours.   Take frequent rest periods for the next 24 hours.   Walking will help expel (get rid of) the air and reduce the bloated feeling in your abdomen.   No driving for 24 hours (because of the anesthesia (medicine) used during the test).   You may shower.   Do not sign any important legal documents or operate any machinery for 24 hours (because of the anesthesia used during the test).  NUTRITION  Drink plenty of fluids.   You may resume your normal diet.   Begin with a light meal and progress to your normal diet.   Avoid alcoholic beverages for 24 hours or as instructed by your caregiver.  MEDICATIONS  You may resume your normal medications unless your caregiver tells you otherwise.  WHAT YOU CAN EXPECT TODAY  You may experience abdominal discomfort such as a feeling of fullness or "gas" pains.  FOLLOW-UP  Your doctor will discuss the results of your test with you.  SEEK IMMEDIATE MEDICAL ATTENTION IF ANY OF THE FOLLOWING OCCUR:  Excessive nausea (feeling sick to your stomach) and/or vomiting.   Severe abdominal pain and distention (swelling).   Trouble swallowing.   Temperature over 101 F (37.8 C).   Rectal bleeding or vomiting of blood.    GERD information provided  Clear liquids for lunch; advance diet as tolerated later today.  Use Chloraseptic spray if you experience  any transient sore throat  Continue Protonix 40 mg every day indefinitely.  Office visit with [place] in 1 year.  Office will notify you.  At patient request, I called Kathryne Gin at [phone] -got voicemail of a business.  Did not leave a message.   Clear Liquid Diet, Adult A clear liquid diet is a diet that includes only liquids and semi-liquids that you can see through. You do not eat any food on this diet. Most people need to follow this diet for only a short time. You may need to follow a clear liquid diet if:  You have a problem right before or after you have surgery.  You did not eat food for a long time.  You had any of these: ? Feeling sick to your stomach (nausea). ? Throwing up (vomiting). ? Passing a watery stool (diarrhea).  You are going to have an exam to look at parts of your digestive system.  You are going to have bowel surgery. The goals of this diet are:  To rest the stomach.  To help you clear the digestive system before an exam.  To make sure that there is enough fluid in your body.  To make sure you get some energy.  To help you get back to eating like you used to. What are tips for following this plan?  A clear liquid is a liquid or semi-liquid that you can see through when you hold it up to a light. An example of this is gelatin.  This diet does not give  you all the nutrients that you need. Choose a variety of the liquids that your doctor says you can drink on this diet. That way, you will get as many nutrients as possible.  If you are not sure whether you can have certain items, ask your doctor. If you are unable to swallow a thin liquid, you will need to thicken it before taking it. This will stop you from breathing it in (aspiration). What foods should I eat?   Water and flavored water.  Fruit juices that do not have pulp, such as cranberry juice and apple juice.  Tea and coffee without milk or cream.  Clear bouillon or  broth.  Broth-based soups that have been strained.  Flavored gelatins.  Honey.  Sugar water.  Ice or frozen ice pops that do not have any milk, yogurt, fruit pieces, or fruit pulp in them.  Clear sodas.  Clear sports drinks. The items listed above may not be a complete list of what you can eat and drink. Contact a dietitian for more options. What foods should I avoid?  Juices that have pulp.  Milk.  Cream or cream-based soups.  Yogurt.  Normal foods that are not clear liquids or semi-liquids. The items listed above may not be a complete list of what you should not eat and drink. Contact a dietitian for options. Questions to ask your health care provider  How long do I need to follow this diet?  Are there any medicines that I should change while on this diet? Summary  A clear liquid diet is a diet that includes only liquids and semi-liquids that you can see through.  Some goals of this diet are to rest your stomach, make sure you get enough fluid, and give you some energy.  Avoid liquids with milk, cream, or pulp while you are on this diet. This information is not intended to replace advice given to you by your health care provider. Make sure you discuss any questions you have with your health care provider. Document Revised: 12/30/2017 Document Reviewed: 12/30/2017 Elsevier Patient Education  [place].  Gastroesophageal Reflux Disease, Adult Gastroesophageal reflux (GER) happens when acid from the stomach flows up into the tube that connects the mouth and the stomach (esophagus). Normally, food travels down the esophagus and stays in the stomach to be digested. However, when a person has GER, food and stomach acid sometimes move back up into the esophagus. If this becomes a more serious problem, the person may be diagnosed with a disease called gastroesophageal reflux disease (GERD). GERD occurs when the reflux:  Happens often.  Causes frequent or severe  symptoms.  Causes problems such as damage to the esophagus. When stomach acid comes in contact with the esophagus, the acid may cause soreness (inflammation) in the esophagus. Over time, GERD may create small holes (ulcers) in the lining of the esophagus. What are the causes? This condition is caused by a problem with the muscle between the esophagus and the stomach (lower esophageal sphincter, or LES). Normally, the LES muscle closes after food passes through the esophagus to the stomach. When the LES is weakened or abnormal, it does not close properly, and that allows food and stomach acid to go back up into the esophagus. The LES can be weakened by certain dietary substances, medicines, and medical conditions, including:  Tobacco use.  Pregnancy.  Having a hiatal hernia.  Alcohol use.  Certain foods and beverages, such as coffee, chocolate, onions, and peppermint. What increases  the risk? You are more likely to develop this condition if you:  Have an increased body weight.  Have a connective tissue disorder.  Use NSAID medicines. What are the signs or symptoms? Symptoms of this condition include:  Heartburn.  Difficult or painful swallowing.  The feeling of having a lump in the throat.  Abitter taste in the mouth.  Bad breath.  Having a large amount of saliva.  Having an upset or bloated stomach.  Belching.  Chest pain. Different conditions can cause chest pain. Make sure you see your health care provider if you experience chest pain.  Shortness of breath or wheezing.  Ongoing (chronic) cough or a night-time cough.  Wearing away of tooth enamel.  Weight loss. How is this diagnosed? Your health care provider will take a medical history and perform a physical exam. To determine if you have mild or severe GERD, your health care provider may also monitor how you respond to treatment. You may also have tests, including:  A test to examine your stomach and  esophagus with a small camera (endoscopy).  A test thatmeasures the acidity level in your esophagus.  A test thatmeasures how much pressure is on your esophagus.  A barium swallow or modified barium swallow test to show the shape, size, and functioning of your esophagus. How is this treated? The goal of treatment is to help relieve your symptoms and to prevent complications. Treatment for this condition may vary depending on how severe your symptoms are. Your health care provider may recommend:  Changes to your diet.  Medicine.  Surgery. Follow these instructions at home: Eating and drinking   Follow a diet as recommended by your health care provider. This may involve avoiding foods and drinks such as: ? Coffee and tea (with or without caffeine). ? Drinks that containalcohol. ? Energy drinks and sports drinks. ? Carbonated drinks or sodas. ? Chocolate and cocoa. ? Peppermint and mint flavorings. ? Garlic and onions. ? Horseradish. ? Spicy and acidic foods, including peppers, chili powder, curry powder, vinegar, hot sauces, and barbecue sauce. ? Citrus fruit juices and citrus fruits, such as oranges, lemons, and limes. ? Tomato-based foods, such as red sauce, chili, salsa, and pizza with red sauce. ? Fried and fatty foods, such as donuts, french fries, potato chips, and high-fat dressings. ? High-fat meats, such as hot dogs and fatty cuts of red and white meats, such as rib eye steak, sausage, ham, and bacon. ? High-fat dairy items, such as whole milk, butter, and cream cheese.  Eat small, frequent meals instead of large meals.  Avoid drinking large amounts of liquid with your meals.  Avoid eating meals during the 2-3 hours before bedtime.  Avoid lying down right after you eat.  Do not exercise right after you eat. Lifestyle   Do not use any products that contain nicotine or tobacco, such as cigarettes, e-cigarettes, and chewing tobacco. If you need help quitting, ask  your health care provider.  Try to reduce your stress by using methods such as yoga or meditation. If you need help reducing stress, ask your health care provider.  If you are overweight, reduce your weight to an amount that is healthy for you. Ask your health care provider for guidance about a safe weight loss goal. General instructions  Pay attention to any changes in your symptoms.  Take over-the-counter and prescription medicines only as told by your health care provider. Do not take aspirin, ibuprofen, or other NSAIDs unless your health  care provider told you to do so.  Wear loose-fitting clothing. Do not wear anything tight around your waist that causes pressure on your abdomen.  Raise (elevate) the head of your bed about 6 inches (15 cm).  Avoid bending over if this makes your symptoms worse.  Keep all follow-up visits as told by your health care provider. This is important. Contact a health care provider if:  You have: ? New symptoms. ? Unexplained weight loss. ? Difficulty swallowing or it hurts to swallow. ? Wheezing or a persistent cough. ? A hoarse voice.  Your symptoms do not improve with treatment. Get help right away if you:  Have pain in your arms, neck, jaw, teeth, or back.  Feel sweaty, dizzy, or light-headed.  Have chest pain or shortness of breath.  Vomit and your vomit looks like blood or coffee grounds.  Faint.  Have stool that is bloody or black.  Cannot swallow, drink, or eat. Summary  Gastroesophageal reflux happens when acid from the stomach flows up into the esophagus. GERD is a disease in which the reflux happens often, causes frequent or severe symptoms, or causes problems such as damage to the esophagus.  Treatment for this condition may vary depending on how severe your symptoms are. Your health care provider may recommend diet and lifestyle changes, medicine, or surgery.  Contact a health care provider if you have new or worsening  symptoms.  Take over-the-counter and prescription medicines only as told by your health care provider. Do not take aspirin, ibuprofen, or other NSAIDs unless your health care provider told you to do so.  Keep all follow-up visits as told by your health care provider. This is important. This information is not intended to replace advice given to you by your health care provider. Make sure you discuss any questions you have with your health care provider. Document Revised: 01/15/2018 Document Reviewed: 01/15/2018 Elsevier Patient Education  [place].

## 2019-11-13 NOTE — Interval H&P Note (Signed)
History and Physical Interval Note:  11/13/2019 12:13 PM  Albert Garner  has presented today for surgery, with the diagnosis of dysphagia.  The various methods of treatment have been discussed with the patient and family. After consideration of risks, benefits and other options for treatment, the patient has consented to  Procedure(s) with comments: ESOPHAGOGASTRODUODENOSCOPY (EGD) (N/A) - 11:45am MALONEY DILATION (N/A) as a surgical intervention.  The patient's history has been reviewed, patient examined, no change in status, stable for surgery.  I have reviewed the patient's chart and labs.  Questions were answered to the patient's satisfaction.     [name]   Patient seen and examined in the endoscopy unit.  No change.  Agree with EGD with esophageal dilation as feasible/appropriate per plan.  The risks, benefits, limitations, alternatives and imponderables have been reviewed with the patient. Potential for esophageal dilation, biopsy, etc. have also been reviewed.  Questions have been answered. All parties agreeable.

## 2019-11-20 ENCOUNTER — Ambulatory Visit: Payer: Medicare Other | Admitting: Internal Medicine

## 2019-11-28 ENCOUNTER — Other Ambulatory Visit: Payer: Self-pay | Admitting: Family Medicine

## 2019-12-26 ENCOUNTER — Other Ambulatory Visit: Payer: Self-pay | Admitting: Family Medicine

## 2019-12-28 NOTE — Telephone Encounter (Signed)
Requested Prescriptions   Pending Prescriptions Disp Refills  . traZODone (DESYREL) 50 MG tablet [Pharmacy Med Name: TRAZODONE HCL 50 MG TAB] 60 tablet 2    Sig: TAKE 2 TABLETS (100 MG TOTAL) BY MOUTH AT BEDTIME    Last OV 05/25/2019   Last written 09/21/2019

## 2020-03-29 ENCOUNTER — Other Ambulatory Visit: Payer: Self-pay | Admitting: Family Medicine

## 2020-05-26 ENCOUNTER — Other Ambulatory Visit: Payer: Self-pay

## 2020-05-26 ENCOUNTER — Ambulatory Visit (INDEPENDENT_AMBULATORY_CARE_PROVIDER_SITE_OTHER): Payer: Medicare Other | Admitting: Family Medicine

## 2020-05-26 VITALS — BP 120/60 | HR 59 | Temp 97.4°F | Ht 73.0 in | Wt 152.0 lb

## 2020-05-26 DIAGNOSIS — Z0001 Encounter for general adult medical examination with abnormal findings: Secondary | ICD-10-CM | POA: Diagnosis not present

## 2020-05-26 DIAGNOSIS — Z0000 Encounter for general adult medical examination without abnormal findings: Secondary | ICD-10-CM | POA: Diagnosis not present

## 2020-05-26 DIAGNOSIS — E7800 Pure hypercholesterolemia, unspecified: Secondary | ICD-10-CM

## 2020-05-26 DIAGNOSIS — I10 Essential (primary) hypertension: Secondary | ICD-10-CM

## 2020-05-26 DIAGNOSIS — I2510 Atherosclerotic heart disease of native coronary artery without angina pectoris: Secondary | ICD-10-CM

## 2020-05-26 DIAGNOSIS — I6521 Occlusion and stenosis of right carotid artery: Secondary | ICD-10-CM

## 2020-05-26 DIAGNOSIS — Z125 Encounter for screening for malignant neoplasm of prostate: Secondary | ICD-10-CM

## 2020-05-26 NOTE — Progress Notes (Signed)
Subjective:    Patient ID: Albert Garner, male    DOB: 02-17-1941, 79 y.o.   MRN: [id number]  HPI  Patient is a very pleasant 79 year old Caucasian male here today for complete physical exam.   His last colonoscopy was in July 2019.  Therefore he does not require any further colon cancer screening.  He is due for prostate cancer screening although he is asymptomatic.  Past medical history is significant for a 1 to 39% right internal carotid artery stenosis.  The bruit on the right side sounds much louder today.  He has not had that checked since 2018.  He is still taking an aspirin.  He denies any TIA symptoms.  He denies any chest pain shortness of breath or dyspnea on exertion.  He denies any falls, depression, or memory loss.  He is due for a flu shot.  He is due for Pneumovax 23.  He is due for shingles vaccine.  He politely refuses all of these.  He did have his Covid shot and I congratulated him on doing that Past Medical History:  Diagnosis Date  . Allergy    occasional seasonal allergies  . Carotid artery occlusion   . Hypercholesteremia   . Hyperlipidemia   . Hypertension    Past Surgical History:  Procedure Laterality Date  . COLONOSCOPY     2003 w/santagade-no report in epic  . COLONOSCOPY  01/2015   Dr. Ardis Hughs: four polyps removed, 3 tubular adenomas. next tcs 3 years  . COLONOSCOPY N/A 01/31/2018   diverticulosis, internal hemorrhoids.   . ESOPHAGOGASTRODUODENOSCOPY N/A 03/09/2017   Dr. Gala Romney: Distal esophagus quite macerated, Schatzki ring not dilated, retained food in the hernia sac.  Marland Kitchen ESOPHAGOGASTRODUODENOSCOPY N/A 04/11/2017   Dr. Gala Romney: low grade of narrowing Schatzki ring s/p dilation  . ESOPHAGOGASTRODUODENOSCOPY N/A 11/13/2019   Procedure: ESOPHAGOGASTRODUODENOSCOPY (EGD);  Surgeon: Daneil Dolin, MD;  Location: [facility];  Service: Endoscopy;  Laterality: N/A;  11:45am  . [place]   left hand 3 rd digit  . FOREIGN BODY REMOVAL N/A 03/09/2017    Procedure: FOREIGN BODY REMOVAL;  Surgeon: Daneil Dolin, MD;  Location: [facility];  Service: Endoscopy;  Laterality: N/A;  . INGUINAL HERNIA REPAIR Right 1986  . MALONEY DILATION N/A 04/11/2017   Procedure: [name] DILATION;  Surgeon: Daneil Dolin, MD;  Location: [facility];  Service: Endoscopy;  Laterality: N/A;  [name] DILATION N/A 11/13/2019   Procedure: [name] DILATION;  Surgeon: Daneil Dolin, MD;  Location: [facility];  Service: Endoscopy;  Laterality: N/A;  . MOUTH SURGERY     with sedation-bottom teeth pulled   Current Outpatient Medications on File Prior to Visit  Medication Sig Dispense Refill  . aspirin 81 MG tablet Take 81 mg by mouth daily.    Marland Kitchen atorvastatin (LIPITOR) 40 MG tablet TAKE 1 TABLET BY MOUTH ONCE DAILY 90 tablet 1  . lisinopril (ZESTRIL) 10 MG tablet Take 1 tablet (10 mg total) by mouth daily. 90 tablet 3  . Nutritional Supplements (ENSURE ORIGINAL PO) Take 237 mLs by mouth 3 (three) times daily as needed (nutritional support). Drinks 1-3 daily    . pantoprazole (PROTONIX) 40 MG tablet Take 1 tablet (40 mg total) by mouth daily. 30 minutes before breakfast 90 tablet 3  . traZODone (DESYREL) 50 MG tablet TAKE 2 TABLETS (100 MG TOTAL) BY MOUTH AT BEDTIME 60 tablet 2   No current facility-administered medications on file prior to visit.  No Known Allergies Social History   Socioeconomic History  . Marital status: Married    Spouse name: Not on file  . Number of children: Not on file  . Years of education: Not on file  . Highest education level: Not on file  Occupational History  . Not on file  Tobacco Use  . Smoking status: Never Smoker  . Smokeless tobacco: Current User    Types: Chew  Vaping Use  . Vaping Use: Never used  Substance and Sexual Activity  . Alcohol use: No  . Drug use: No  . Sexual activity: Yes  Other Topics Concern  . Not on file  Social History Narrative  . Not on file   Social Determinants of Health    Financial Resource Strain:   . Difficulty of Paying Living Expenses: Not on file  Food Insecurity:   . Worried About Charity fundraiser in the Last Year: Not on file  . Ran Out of Food in the Last Year: Not on file  Transportation Needs:   . Lack of Transportation (Medical): Not on file  . Lack of Transportation (Non-Medical): Not on file  Physical Activity:   . Days of Exercise per Week: Not on file  . Minutes of Exercise per Session: Not on file  Stress:   . Feeling of Stress : Not on file  Social Connections:   . Frequency of Communication with Friends and Family: Not on file  . Frequency of Social Gatherings with Friends and Family: Not on file  . Attends Religious Services: Not on file  . Active Member of Clubs or Organizations: Not on file  . Attends Archivist Meetings: Not on file  . Marital Status: Not on file  Intimate Partner Violence:   . Fear of Current or Ex-Partner: Not on file  . Emotionally Abused: Not on file  . Physically Abused: Not on file  . Sexually Abused: Not on file   Family History  Problem Relation Age of Onset  . Heart disease Mother   . Throat cancer Brother   . Colon cancer Neg Hx   . Rectal cancer Neg Hx   . Stomach cancer Neg Hx       Review of Systems  All other systems reviewed and are negative.      Objective:   Physical Exam Vitals reviewed.  Constitutional:      General: He is not in acute distress.    Appearance: He is well-developed. He is not diaphoretic.  HENT:     Head: Normocephalic and atraumatic.     Right Ear: External ear normal.     Left Ear: External ear normal.     Nose: Nose normal.     Mouth/Throat:     Pharynx: No oropharyngeal exudate.  Eyes:     General: No scleral icterus.       Right eye: No discharge.        Left eye: No discharge.     Conjunctiva/sclera: Conjunctivae normal.     Pupils: Pupils are equal, round, and reactive to light.  Neck:     Thyroid: No thyromegaly.      Vascular: No JVD.     Trachea: No tracheal deviation.  Cardiovascular:     Rate and Rhythm: Normal rate and regular rhythm.     Heart sounds: Normal heart sounds. No murmur heard.  No friction rub. No gallop.   Pulmonary:     Effort: Pulmonary effort is normal. No  respiratory distress.     Breath sounds: Normal breath sounds. No stridor. No wheezing or rales.  Chest:     Chest wall: No tenderness.  Abdominal:     General: Bowel sounds are normal. There is no distension.     Palpations: Abdomen is soft. There is no mass.     Tenderness: There is no abdominal tenderness. There is no guarding or rebound.  Musculoskeletal:        General: No tenderness. Normal range of motion.     Cervical back: Normal range of motion and neck supple.  Lymphadenopathy:     Cervical: No cervical adenopathy.  Skin:    General: Skin is warm.     Coloration: Skin is not pale.     Findings: No erythema or rash.  Neurological:     Mental Status: He is alert and oriented to person, place, and time.     Cranial Nerves: No cranial nerve deficit.     Motor: No abnormal muscle tone.     Coordination: Coordination normal.     Deep Tendon Reflexes: Reflexes are normal and symmetric.  Psychiatric:        Behavior: Behavior normal.        Thought Content: Thought content normal.        Judgment: Judgment normal.         Assessment & Plan:  Routine general medical examination at a health care facility - Plan: CBC with Differential/Platelet, COMPLETE METABOLIC PANEL WITH GFR, Lipid panel, PSA  ASCVD (arteriosclerotic cardiovascular disease) - Plan: CBC with Differential/Platelet, COMPLETE METABOLIC PANEL WITH GFR, Lipid panel  Pure hypercholesterolemia - Plan: CBC with Differential/Platelet, COMPLETE METABOLIC PANEL WITH GFR, Lipid panel  Benign essential HTN - Plan: CBC with Differential/Platelet, COMPLETE METABOLIC PANEL WITH GFR, Lipid panel  Stenosis of right carotid artery - Plan: CBC with  Differential/Platelet, COMPLETE METABOLIC PANEL WITH GFR, Lipid panel, US Carotid Duplex Bilateral  Prostate cancer screening - Plan: PSA  Patient denies any falls, depression, or memory loss.  Blood pressure is outstanding.  Recommended Pneumovax 23, shingles vaccine, and a flu shot but he politely declined.  Covid shot and Prevnar 13 are up-to-date.  Check CBC, CMP, fasting lipid panel.  Goal LDL cholesterol is less than 70.  Check PSA to screen for prostate cancer.  I will also obtain a carotid ultrasound.  If carotid artery stenosis is greater than 70%, we will need to reconsider our treatment strategy.  Otherwise continue aspirin and high-dose statin.

## 2020-05-27 ENCOUNTER — Ambulatory Visit: Payer: Medicare Other

## 2020-05-27 ENCOUNTER — Other Ambulatory Visit (INDEPENDENT_AMBULATORY_CARE_PROVIDER_SITE_OTHER): Payer: Self-pay

## 2020-05-27 DIAGNOSIS — H903 Sensorineural hearing loss, bilateral: Secondary | ICD-10-CM | POA: Diagnosis not present

## 2020-05-27 DIAGNOSIS — Z23 Encounter for immunization: Secondary | ICD-10-CM

## 2020-05-27 LAB — CBC WITH DIFFERENTIAL/PLATELET
Absolute Monocytes: 349 cells/uL (ref 200–950)
Basophils Absolute: 8 cells/uL (ref 0–200)
Basophils Relative: 0.2 %
Eosinophils Absolute: 50 cells/uL (ref 15–500)
Eosinophils Relative: 1.2 %
HCT: 37.1 % — ABNORMAL LOW (ref 38.5–50.0)
Hemoglobin: 12.1 g/dL — ABNORMAL LOW (ref 13.2–17.1)
Lymphs Abs: 1071 cells/uL (ref 850–3900)
MCH: 31.3 pg (ref 27.0–33.0)
MCHC: 32.6 g/dL (ref 32.0–36.0)
MCV: 96.1 fL (ref 80.0–100.0)
MPV: 10.2 fL (ref 7.5–12.5)
Monocytes Relative: 8.3 %
Neutro Abs: 2722 cells/uL (ref 1500–7800)
Neutrophils Relative %: 64.8 %
Platelets: 160 10*3/uL (ref 140–400)
RBC: 3.86 10*6/uL — ABNORMAL LOW (ref 4.20–5.80)
RDW: 11.9 % (ref 11.0–15.0)
Total Lymphocyte: 25.5 %
WBC: 4.2 10*3/uL (ref 3.8–10.8)

## 2020-05-27 LAB — COMPLETE METABOLIC PANEL WITH GFR
AG Ratio: 1.7 (calc) (ref 1.0–2.5)
ALT: 12 U/L (ref 9–46)
AST: 20 U/L (ref 10–35)
Albumin: 4 g/dL (ref 3.6–5.1)
Alkaline phosphatase (APISO): 68 U/L (ref 35–144)
BUN: 20 mg/dL (ref 7–25)
CO2: 28 mmol/L (ref 20–32)
Calcium: 9.2 mg/dL (ref 8.6–10.3)
Chloride: 105 mmol/L (ref 98–110)
Creat: 1.09 mg/dL (ref 0.70–1.18)
GFR, Est African American: 74 mL/min/{1.73_m2} (ref >=60)
GFR, Est Non African American: 64 mL/min/{1.73_m2} (ref >=60)
Globulin: 2.4 g/dL (calc) (ref 1.9–3.7)
Glucose, Bld: 88 mg/dL (ref 65–99)
Potassium: 4.3 mmol/L (ref 3.5–5.3)
Sodium: 141 mmol/L (ref 135–146)
Total Bilirubin: 0.7 mg/dL (ref 0.2–1.2)
Total Protein: 6.4 g/dL (ref 6.1–8.1)

## 2020-05-27 LAB — LIPID PANEL
Cholesterol: 128 mg/dL (ref <200)
HDL: 42 mg/dL (ref >=40)
LDL Cholesterol (Calc): 68 mg/dL (calc)
Non-HDL Cholesterol (Calc): 86 mg/dL (calc) (ref <130)
Total CHOL/HDL Ratio: 3 (calc) (ref <5.0)
Triglycerides: 96 mg/dL (ref <150)

## 2020-05-27 LAB — PSA: PSA: 0.69 ng/mL (ref <=4.0)

## 2020-06-03 ENCOUNTER — Other Ambulatory Visit: Payer: Medicare Other

## 2020-06-13 ENCOUNTER — Other Ambulatory Visit: Payer: Self-pay | Admitting: *Deleted

## 2020-06-13 DIAGNOSIS — I6529 Occlusion and stenosis of unspecified carotid artery: Secondary | ICD-10-CM

## 2020-06-24 ENCOUNTER — Ambulatory Visit: Payer: Medicare Other

## 2020-06-24 ENCOUNTER — Ambulatory Visit (HOSPITAL_COMMUNITY)
Admission: RE | Admit: 2020-06-24 | Discharge: 2020-06-24 | Disposition: A | Discharge status: Home / Self Care | Payer: Medicare Other | Source: Ambulatory Visit | Attending: Physician Assistant | Admitting: Physician Assistant

## 2020-06-24 ENCOUNTER — Other Ambulatory Visit: Payer: Self-pay

## 2020-06-24 ENCOUNTER — Encounter (HOSPITAL_COMMUNITY): Payer: Medicare Other

## 2020-06-24 ENCOUNTER — Ambulatory Visit: Payer: Medicare Other | Admitting: Physician Assistant

## 2020-06-24 VITALS — BP 130/62 | HR 62 | Temp 98.3°F | Resp 20 | Ht 73.0 in | Wt 155.7 lb

## 2020-06-24 DIAGNOSIS — I6529 Occlusion and stenosis of unspecified carotid artery: Secondary | ICD-10-CM

## 2020-06-24 NOTE — Progress Notes (Signed)
Office Note     CC:  follow up Requesting Provider:  [name], MD  HPI: Albert Garner is a 79 y.o. ([birth year]) male who presents for follow up of carotid stenosis. His carotid stenosis has been 1-39% bilaterally. He has no history of prior surgery. He has no previous history of TIA or Stroke. He says he has been feeling great since his last visit. He denies any history of amaurosis fugax or other visual changes, facial drooping, slurred speech, or upper or lower extremity weakness or numbness  He denies any lower or upper extremity symptoms. He does not have claudication, rest pain or non healing wounds. He just recently went hunting with his son and he explained that he walked from the hunting stand back to his truck which was a little over 2 miles with no issues. He walks quite frequently and remains very active hunting and doing wood work.   The pt is on a statin for cholesterol management.  The pt is on a daily aspirin.   Other AC:  none The pt is on ACE for hypertension.   The pt is not diabetic.  Tobacco hx: never smoker, does use chew intermittently  Past Medical History:  Diagnosis Date  . Allergy    occasional seasonal allergies  . Carotid artery occlusion   . Hypercholesteremia   . Hyperlipidemia   . Hypertension     Past Surgical History:  Procedure Laterality Date  . COLONOSCOPY     2003 w/santagade-no report in epic  . COLONOSCOPY  01/2015   Dr. Ardis Hughs: four polyps removed, 3 tubular adenomas. next tcs 3 years  . COLONOSCOPY N/A 01/31/2018   diverticulosis, internal hemorrhoids.   . ESOPHAGOGASTRODUODENOSCOPY N/A 03/09/2017   Dr. Gala Romney: Distal esophagus quite macerated, Schatzki ring not dilated, retained food in the hernia sac.  Marland Kitchen ESOPHAGOGASTRODUODENOSCOPY N/A 04/11/2017   Dr. Gala Romney: low grade of narrowing Schatzki ring s/p dilation  . ESOPHAGOGASTRODUODENOSCOPY N/A 11/13/2019   Procedure: ESOPHAGOGASTRODUODENOSCOPY (EGD);  Surgeon: Daneil Dolin, MD;   [facility];  Service: Endoscopy;  Laterality: N/A;  11:45am  . [place]   left hand 3 rd digit  . FOREIGN BODY REMOVAL N/A 03/09/2017   Procedure: FOREIGN BODY REMOVAL;  Surgeon: Daneil Dolin, MD;  [facility];  Service: Endoscopy;  Laterality: N/A;  . INGUINAL HERNIA REPAIR Right 1986  . MALONEY DILATION N/A 04/11/2017   Procedure: [name] DILATION;  Surgeon: Daneil Dolin, MD;  [facility];  Service: Endoscopy;  Laterality: N/A;  [name] DILATION N/A 11/13/2019   Procedure: [name] DILATION;  Surgeon: Daneil Dolin, MD;  [facility];  Service: Endoscopy;  Laterality: N/A;  . MOUTH SURGERY     with sedation-bottom teeth pulled    Social History   Socioeconomic History  . Marital status: Married    Spouse name: Not on file  . Number of children: Not on file  . Years of education: Not on file  . Highest education level: Not on file  Occupational History  . Not on file  Tobacco Use  . Smoking status: Never Smoker  . Smokeless tobacco: Current User    Types: Chew  Vaping Use  . Vaping Use: Never used  Substance and Sexual Activity  . Alcohol use: No  . Drug use: No  . Sexual activity: Yes  Other Topics Concern  . Not on file  Social History Narrative  . Not on file  Social Determinants of Health   Financial Resource Strain:   . Difficulty of Paying Living Expenses: Not on file  Food Insecurity:   . Worried About Charity fundraiser in the Last Year: Not on file  . Ran Out of Food in the Last Year: Not on file  Transportation Needs:   . Lack of Transportation (Medical): Not on file  . Lack of Transportation (Non-Medical): Not on file  Physical Activity:   . Days of Exercise per Week: Not on file  . Minutes of Exercise per Session: Not on file  Stress:   . Feeling of Stress : Not on file  Social Connections:   . Frequency of Communication with Friends and Family: Not on file  . Frequency of  Social Gatherings with Friends and Family: Not on file  . Attends Religious Services: Not on file  . Active Member of Clubs or Organizations: Not on file  . Attends Archivist Meetings: Not on file  . Marital Status: Not on file  Intimate Partner Violence:   . Fear of Current or Ex-Partner: Not on file  . Emotionally Abused: Not on file  . Physically Abused: Not on file  . Sexually Abused: Not on file    Family History  Problem Relation Age of Onset  . Heart disease Mother   . Throat cancer Brother   . Colon cancer Neg Hx   . Rectal cancer Neg Hx   . Stomach cancer Neg Hx     Current Outpatient Medications  Medication Sig Dispense Refill  . aspirin 81 MG tablet Take 81 mg by mouth daily.    Marland Kitchen atorvastatin (LIPITOR) 40 MG tablet TAKE 1 TABLET BY MOUTH ONCE DAILY 90 tablet 1  . lisinopril (ZESTRIL) 10 MG tablet Take 1 tablet (10 mg total) by mouth daily. 90 tablet 3  . Nutritional Supplements (ENSURE ORIGINAL PO) Take 237 mLs by mouth 3 (three) times daily as needed (nutritional support). Drinks 1-3 daily    . pantoprazole (PROTONIX) 40 MG tablet Take 1 tablet (40 mg total) by mouth daily. 30 minutes before breakfast 90 tablet 3  . traZODone (DESYREL) 50 MG tablet TAKE 2 TABLETS (100 MG TOTAL) BY MOUTH AT BEDTIME 60 tablet 2   No current facility-administered medications for this visit.    No Known Allergies   REVIEW OF SYSTEMS:  [X]  denotes positive finding, [ ]  denotes negative finding Cardiac  Comments:  Chest pain or chest pressure:    Shortness of breath upon exertion:    Short of breath when lying flat:    Irregular heart rhythm:        Vascular    Pain in calf, thigh, or hip brought on by ambulation:    Pain in feet at night that wakes you up from your sleep:     Blood clot in your veins:    Leg swelling:         Pulmonary    Oxygen at home:    Productive cough:     Wheezing:         Neurologic    Sudden weakness in arms or legs:     Sudden  numbness in arms or legs:     Sudden onset of difficulty speaking or slurred speech:    Temporary loss of vision in one eye:     Problems with dizziness:         Gastrointestinal    Blood in stool:     Vomited  blood:         Genitourinary    Burning when urinating:     Blood in urine:        Psychiatric    Major depression:         Hematologic    Bleeding problems:    Problems with blood clotting too easily:        Skin    Rashes or ulcers:        Constitutional    Fever or chills:      PHYSICAL EXAMINATION:  Vitals:   06/24/20 0944 06/24/20 0946  BP: 129/62 130/62  Pulse: 62   Resp: 20   Temp: 98.3 F (36.8 C)   TempSrc: Temporal   SpO2: 99%   Weight: 155 lb 11.2 oz (70.6 kg)   Height: 6\' 1"  (1.854 m)     General: very pleasant gentleman, well developed, well nourished, not in any discomfort Gait: Normal HENT: WNL, normocephalic Pulmonary: normal non-labored breathing , without wheezing Cardiac: regular HR, without  Murmurs without carotid bruit, does have right subclavian bruit Abdomen: soft, NT, no masses Skin: with rashes Vascular Exam/Pulses:  Right Left  Radial 2+ (normal) 2+ (normal)  Ulnar 2+ (normal) 2+ (normal)  Femoral 2+ (normal) 2+ (normal)  Popliteal 2+ (normal) 2+ (normal)  DP 2+ (normal) 2+ (normal)  PT 2+ (normal) 2+ (normal)   Extremities: without ischemic changes, without Gangrene , without cellulitis; without open wounds;  Musculoskeletal: no muscle wasting or atrophy  Neurologic: A&O X 3;  No focal weakness or paresthesias are detected Psychiatric:  The pt has Normal affect.   Non-Invasive Vascular Imaging:   Right Carotid: Velocities in the right ICA are consistent with a 1-39% stenosis. The ECA appears >50% stenosed.   Left Carotid: Velocities in the left ICA are consistent with a 1-39% stenosis.   Vertebrals: Bilateral vertebral arteries demonstrate antegrade flow.  Subclavians: Right subclavian artery flow was disturbed.  Normal flow hemodynamics were seen in the left subclavian artery.    ASSESSMENT/PLAN:: 79 y.o. male here for follow up for minimal extracranial carotid stenosis. His duplex today shows stable 1-39% stenosis bilaterally. 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 He otherwise remains neurologically intact. He continues his Aspirin and Statin. His blood pressure is well controlled. He remains very active -he will follow up in 2 years with carotid duplex   Karoline Caldwell, PA-C [facility] [phone]  Clinic MD:   Dr. Stanford Breed

## 2020-06-28 ENCOUNTER — Other Ambulatory Visit: Payer: Self-pay | Admitting: Family Medicine

## 2020-07-25 ENCOUNTER — Other Ambulatory Visit: Payer: Self-pay | Admitting: Family Medicine

## 2020-07-25 DIAGNOSIS — I10 Essential (primary) hypertension: Secondary | ICD-10-CM

## 2020-08-11 ENCOUNTER — Other Ambulatory Visit: Payer: Self-pay | Admitting: *Deleted

## 2020-08-11 MED ORDER — TRAZODONE HCL 50 MG PO TABS: 100.0000 mg | ORAL_TABLET | Freq: Every day | ORAL | 2 refills | Status: DC

## 2020-08-29 ENCOUNTER — Other Ambulatory Visit: Payer: Self-pay | Admitting: Family Medicine

## 2020-09-16 DIAGNOSIS — Z961 Presence of intraocular lens: Secondary | ICD-10-CM | POA: Diagnosis not present

## 2020-09-16 DIAGNOSIS — H524 Presbyopia: Secondary | ICD-10-CM | POA: Diagnosis not present

## 2020-09-29 ENCOUNTER — Encounter: Payer: Self-pay | Admitting: Internal Medicine

## 2020-10-20 ENCOUNTER — Other Ambulatory Visit: Payer: Self-pay | Admitting: Gastroenterology

## 2020-11-08 ENCOUNTER — Ambulatory Visit: Payer: Medicare Other | Admitting: Internal Medicine

## 2020-11-08 ENCOUNTER — Other Ambulatory Visit: Payer: Self-pay

## 2020-11-08 ENCOUNTER — Encounter: Payer: Self-pay | Admitting: Internal Medicine

## 2020-11-08 VITALS — BP 124/60 | HR 63 | Temp 96.6°F | Ht 73.0 in | Wt 157.4 lb

## 2020-11-08 DIAGNOSIS — K219 Gastro-esophageal reflux disease without esophagitis: Secondary | ICD-10-CM | POA: Diagnosis not present

## 2020-11-08 DIAGNOSIS — R1319 Other dysphagia: Secondary | ICD-10-CM | POA: Diagnosis not present

## 2020-11-08 NOTE — Patient Instructions (Signed)
Continue Protonix 40 mg daily-take indefinitely  GERD information provided  As discussed, no future colonoscopy recommended  May or may not need your esophagus stretched again in the future  Office visit with me in 1 year and as needed.

## 2020-11-08 NOTE — Progress Notes (Signed)
Primary Care Physician:  [name], MD Primary Gastroenterologist:  Dr. Gala Romney  Pre-Procedure History & Physical: HPI:  Albert Garner is a 80 y.o. male here for follow-up of GERD/dysphagia.  Prior Schatzki's ring dilated.  He has done well;  he has had no dysphagia issues and his reflux has been well controlled on Protonix 40 mg daily.  History of colonic adenomas removed previously with a negative colonoscopy (diverticulosis) 2019.  No future colonoscopy recommended.  He has gained 3 pounds since his last office visit.  Clinically, he states he is doing very well.  Sees Dr. Dennard Schaumann on a regular basis.  Past Medical History:  Diagnosis Date  . Allergy    occasional seasonal allergies  . Carotid artery occlusion   . Hypercholesteremia   . Hyperlipidemia   . Hypertension     Past Surgical History:  Procedure Laterality Date  . COLONOSCOPY     2003 w/santagade-no report in epic  . COLONOSCOPY  01/2015   Dr. Ardis Hughs: four polyps removed, 3 tubular adenomas. next tcs 3 years  . COLONOSCOPY N/A 01/31/2018   diverticulosis, internal hemorrhoids.   . ESOPHAGOGASTRODUODENOSCOPY N/A 03/09/2017   Dr. Gala Romney: Distal esophagus quite macerated, Schatzki ring not dilated, retained food in the hernia sac.  Marland Kitchen ESOPHAGOGASTRODUODENOSCOPY N/A 04/11/2017   Dr. Gala Romney: low grade of narrowing Schatzki ring s/p dilation  . ESOPHAGOGASTRODUODENOSCOPY N/A 11/13/2019   Procedure: ESOPHAGOGASTRODUODENOSCOPY (EGD);  Surgeon: Daneil Dolin, MD;  [facility];  Service: Endoscopy;  Laterality: N/A;  11:45am  . [place]   left hand 3 rd digit  . FOREIGN BODY REMOVAL N/A 03/09/2017   Procedure: FOREIGN BODY REMOVAL;  Surgeon: Daneil Dolin, MD;  [facility];  Service: Endoscopy;  Laterality: N/A;  . INGUINAL HERNIA REPAIR Right 1986  . MALONEY DILATION N/A 04/11/2017   Procedure: [name] DILATION;  Surgeon: Daneil Dolin, MD;  [facility];  Service:  Endoscopy;  Laterality: N/A;  [name] DILATION N/A 11/13/2019   Procedure: [name] DILATION;  Surgeon: Daneil Dolin, MD;  [facility];  Service: Endoscopy;  Laterality: N/A;  . MOUTH SURGERY     with sedation-bottom teeth pulled    Prior to Admission medications   Medication Sig Start Date End Date Taking? Authorizing Provider  aspirin 81 MG tablet Take 81 mg by mouth daily.   Yes [provider]  atorvastatin (LIPITOR) 40 MG tablet TAKE 1 TABLET BY MOUTH ONCE DAILY 08/29/20  Yes [name], MD  lisinopril (ZESTRIL) 10 MG tablet TAKE 1 TABLET BY MOUTH ONCE DAILY 07/25/20  Yes [name], MD  Nutritional Supplements (ENSURE ORIGINAL PO) Take 237 mLs by mouth 3 (three) times daily as needed (nutritional support). Drinks 1-3 daily   Yes [provider]  pantoprazole (PROTONIX) 40 MG tablet TAKE 1 TABLET BY MOUTH ONCE DAILY 30 MINUTES BEFORE BREAKFAST 10/24/20  Yes Erenest Rasher, PA-C  traZODone (DESYREL) 50 MG tablet Take 2 tablets (100 mg total) by mouth at bedtime. 08/11/20  Yes [name], MD    Allergies as of 11/08/2020  . (No Known Allergies)    Family History  Problem Relation Age of Onset  . Heart disease Mother   . Throat cancer Brother   . Colon cancer Neg Hx   . Rectal cancer Neg Hx   . Stomach cancer Neg Hx     Social History   Socioeconomic History  .  Marital status: Married    Spouse name: Not on file  . Number of children: Not on file  . Years of education: Not on file  . Highest education level: Not on file  Occupational History  . Not on file  Tobacco Use  . Smoking status: Never Smoker  . Smokeless tobacco: Current User    Types: Chew  Vaping Use  . Vaping Use: Never used  Substance and Sexual Activity  . Alcohol use: No  . Drug use: No  . Sexual activity: Yes  Other Topics Concern  . Not on file  Social History Narrative  . Not on file   Social Determinants of Health   Financial Resource  Strain: Not on file  Food Insecurity: Not on file  Transportation Needs: Not on file  Physical Activity: Not on file  Stress: Not on file  Social Connections: Not on file  Intimate Partner Violence: Not on file    Review of Systems: See HPI, otherwise negative ROS  Physical Exam: BP 124/60   Pulse 63   Temp (!) 96.6 F (35.9 C) (Temporal)   Ht 6\' 1"  (1.854 m)   Wt 157 lb 6.4 oz (71.4 kg)   BMI 20.77 kg/m  General:   Alert,  Well-developed, well-nourished, pleasant and cooperative in NAD  Impression/Plan: 80 year old gentleman with longstanding GERD  - well-controlled on Protonix 40 mg daily.  History Schatzki's ring dilated previously with durable improvement in dysphagia symptoms thus far.  Distant history of colonic adenoma with negative colonoscopy (diverticulosis) 2019 with no future colonoscopy planned.   Recommendations:  Continue Protonix 40 mg daily-take indefinitely  GERD information provided  As discussed, no future colonoscopy recommended  May or may not need your esophagus stretched again in the future  Office visit with me in 1 year and as needed.    Notice: This dictation was prepared with Dragon dictation along with smaller phrase technology. Any transcriptional errors that result from this process are unintentional and may not be corrected upon review.

## 2021-02-02 ENCOUNTER — Other Ambulatory Visit: Payer: Self-pay | Admitting: Family Medicine

## 2021-05-02 ENCOUNTER — Other Ambulatory Visit: Payer: Self-pay | Admitting: Family Medicine

## 2021-05-02 DIAGNOSIS — I10 Essential (primary) hypertension: Secondary | ICD-10-CM

## 2021-05-26 ENCOUNTER — Ambulatory Visit: Payer: Medicare Other | Admitting: Family Medicine

## 2021-06-02 ENCOUNTER — Ambulatory Visit (INDEPENDENT_AMBULATORY_CARE_PROVIDER_SITE_OTHER): Payer: Medicare Other | Admitting: Family Medicine

## 2021-06-02 ENCOUNTER — Other Ambulatory Visit: Payer: Self-pay

## 2021-06-02 VITALS — BP 128/64 | HR 64 | Temp 98.1°F | Resp 16 | Ht 73.0 in | Wt 151.0 lb

## 2021-06-02 DIAGNOSIS — I2510 Atherosclerotic heart disease of native coronary artery without angina pectoris: Secondary | ICD-10-CM

## 2021-06-02 DIAGNOSIS — E7800 Pure hypercholesterolemia, unspecified: Secondary | ICD-10-CM | POA: Diagnosis not present

## 2021-06-02 DIAGNOSIS — Z0000 Encounter for general adult medical examination without abnormal findings: Secondary | ICD-10-CM | POA: Diagnosis not present

## 2021-06-02 DIAGNOSIS — I10 Essential (primary) hypertension: Secondary | ICD-10-CM | POA: Diagnosis not present

## 2021-06-02 MED ORDER — TRIAMCINOLONE ACETONIDE 0.1 % EX CREA: 1.0000 "application " | TOPICAL_CREAM | Freq: Two times a day (BID) | CUTANEOUS | 1 refills | Status: AC

## 2021-06-02 NOTE — Progress Notes (Signed)
Subjective:    Patient ID: Albert Garner, male    DOB: 1941-07-17, 80 y.o.   MRN: [id number]  HPI  Patient is a very pleasant 80 year old Caucasian male here today for complete physical exam.   His last colonoscopy was in July 2019.  Therefore he does not require any further colon cancer screening.  Carotid screen 12/21- Summary:  Right Carotid: Velocities in the right ICA are consistent with a 1-39%  stenosis.                 The ECA appears >50% stenosed.   Left Carotid: Velocities in the left ICA are consistent with a 1-39%  stenosis.   Vertebrals:  Bilateral vertebral arteries demonstrate antegrade flow.  Subclavians: Right subclavian artery flow was disturbed. Normal flow               hemodynamics were seen in the left subclavian artery.   Patient is here today for complete physical exam.  Overall he is doing outstanding.  He denies any concerns.  He is due for Pneumovax 23, flu shot, shingles shot, and the COVID booster.  We discussed all for these shots in detail today but the patient politely declines them all.  Due to his age she does not require colonoscopy or prostate cancer screening.  Last year, the bruit that I heard in his neck was confirmed to be a blockage in the external carotid artery.  He denies any strokelike symptoms or TIA symptoms.  Overall he is doing well with no concerns.  He does have a patch of eczema on his lateral left shin.  This patch of eczema is roughly 1-1/2 inches x 4 inches. Immunization History  Administered Date(s) Administered   Pneumococcal Conjugate-13 10/27/2015   Tdap 05/27/2020    Past Medical History:  Diagnosis Date   Allergy    occasional seasonal allergies   Carotid artery occlusion    Hypercholesteremia    Hyperlipidemia    Hypertension    Past Surgical History:  Procedure Laterality Date   COLONOSCOPY     2003 w/santagade-no report in epic   COLONOSCOPY  01/2015   Dr. Ardis Hughs: four polyps removed, 3 tubular adenomas. next  tcs 3 years   COLONOSCOPY N/A 01/31/2018   diverticulosis, internal hemorrhoids.    ESOPHAGOGASTRODUODENOSCOPY N/A 03/09/2017   Dr. Gala Romney: Distal esophagus quite macerated, Schatzki ring not dilated, retained food in the hernia sac.   ESOPHAGOGASTRODUODENOSCOPY N/A 04/11/2017   Dr. 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   FOREIGN BODY REMOVAL N/A 03/09/2017   Procedure: FOREIGN BODY REMOVAL;  Surgeon: Daneil Dolin, MD;  [facility];  Service: Endoscopy;  Laterality: N/A;   INGUINAL HERNIA REPAIR Right 1986   MALONEY DILATION N/A 04/11/2017   Procedure: MALONEY DILATION;  Surgeon: Daneil Dolin, MD;  [facility];  Service: Endoscopy;  Laterality: N/A;   MALONEY DILATION N/A 11/13/2019   Procedure: [name] DILATION;  Surgeon: Daneil Dolin, MD;  [facility];  Service: Endoscopy;  Laterality: N/A;   MOUTH SURGERY     with sedation-bottom teeth pulled   Current Outpatient Medications on File Prior to Visit  Medication Sig Dispense Refill   aspirin 81 MG tablet  Take 81 mg by mouth daily.     atorvastatin (LIPITOR) 40 MG tablet TAKE 1 TABLET BY MOUTH ONCE DAILY 90 tablet 1   lisinopril (ZESTRIL) 10 MG tablet TAKE 1 TABLET BY MOUTH ONCE DAILY 90 tablet 3   Nutritional Supplements (ENSURE ORIGINAL PO) Take 237 mLs by mouth 3 (three) times daily as needed (nutritional support). Drinks 1-3 daily     pantoprazole (PROTONIX) 40 MG tablet TAKE 1 TABLET BY MOUTH ONCE DAILY 30 MINUTES BEFORE BREAKFAST 90 tablet 1   traZODone (DESYREL) 50 MG tablet TAKE 2 TABLETS BY MOUTH EVERY NIGHT AT BEDTIME 180 tablet 2   No current facility-administered medications on file prior to visit.   No Known Allergies Social  History   Socioeconomic History   Marital status: Married    Spouse name: Not on file   Number of children: Not on file   Years of education: Not on file   Highest education level: Not on file  Occupational History   Not on file  Tobacco Use   Smoking status: Never   Smokeless tobacco: Current    Types: Chew  Vaping Use   Vaping Use: Never used  Substance and Sexual Activity   Alcohol use: No   Drug use: No   Sexual activity: Yes  Other Topics Concern   Not on file  Social History Narrative   Not on file   Social Determinants of Health   Financial Resource Strain: Not on file  Food Insecurity: Not on file  Transportation Needs: Not on file  Physical Activity: Not on file  Stress: Not on file  Social Connections: Not on file  Intimate Partner Violence: Not on file   Family History  Problem Relation Age of Onset   Heart disease Mother    Throat cancer Brother    Colon cancer Neg Hx    Rectal cancer Neg Hx    Stomach cancer Neg Hx       Review of Systems  All other systems reviewed and are negative.     Objective:   Physical Exam Vitals reviewed.  Constitutional:      General: He is not in acute distress.    Appearance: He is well-developed. He is not diaphoretic.  HENT:     Head: Normocephalic and atraumatic.     Right Ear: External ear normal.     Left Ear: External ear normal.     Nose: Nose normal.     Mouth/Throat:     Pharynx: No oropharyngeal exudate.  Eyes:     General: No scleral icterus.       Right eye: No discharge.        Left eye: No discharge.     Conjunctiva/sclera: Conjunctivae normal.     Pupils: Pupils are equal, round, and reactive to light.  Neck:     Thyroid: No thyromegaly.     Vascular: No JVD.     Trachea: No tracheal deviation.  Cardiovascular:     Rate and Rhythm: Normal rate and regular rhythm.     Heart sounds: Normal heart sounds. No murmur heard.   No friction rub. No gallop.  Pulmonary:     Effort: Pulmonary  effort is normal. No respiratory distress.     Breath sounds: Normal breath sounds. No stridor. No wheezing or rales.  Chest:     Chest wall: No tenderness.  Abdominal:     General: Bowel sounds are normal. There is no distension.  Palpations: Abdomen is soft. There is no mass.     Tenderness: There is no abdominal tenderness. There is no guarding or rebound.  Musculoskeletal:        General: No tenderness. Normal range of motion.     Cervical back: Normal range of motion and neck supple.  Lymphadenopathy:     Cervical: No cervical adenopathy.  Skin:    General: Skin is warm.     Coloration: Skin is not pale.     Findings: No erythema or rash.  Neurological:     Mental Status: He is alert and oriented to person, place, and time.     Cranial Nerves: No cranial nerve deficit.     Motor: No abnormal muscle tone.     Coordination: Coordination normal.     Deep Tendon Reflexes: Reflexes are normal and symmetric.  Psychiatric:        Behavior: Behavior normal.        Thought Content: Thought content normal.        Judgment: Judgment normal.        Assessment & Plan:  Routine general medical examination at a health care facility  ASCVD (arteriosclerotic cardiovascular disease) - Plan: CBC with Differential/Platelet, COMPLETE METABOLIC PANEL WITH GFR, Lipid panel  Benign essential HTN - Plan: CBC with Differential/Platelet, COMPLETE METABOLIC PANEL WITH GFR, Lipid panel  Pure hypercholesterolemia - Plan: CBC with Differential/Platelet, COMPLETE METABOLIC PANEL WITH GFR, Lipid panel Physical exam today is outstanding.  Check CBC CMP and fasting lipid panel.  Recommended flu shot, COVID shot, shingles vaccine, Pneumovax 23.  Patient declined all of these today.  Colonoscopy is up-to-date.  No longer requires prostate cancer screening.  Blood pressure today is outstanding at 128/64.  We will treat eczema on his left shin with triamcinolone cream twice daily for 7 days

## 2021-06-03 LAB — COMPLETE METABOLIC PANEL WITH GFR
AG Ratio: 1.9 (calc) (ref 1.0–2.5)
ALT: 11 U/L (ref 9–46)
AST: 19 U/L (ref 10–35)
Albumin: 4.2 g/dL (ref 3.6–5.1)
Alkaline phosphatase (APISO): 73 U/L (ref 35–144)
BUN: 20 mg/dL (ref 7–25)
CO2: 30 mmol/L (ref 20–32)
Calcium: 9.2 mg/dL (ref 8.6–10.3)
Chloride: 105 mmol/L (ref 98–110)
Creat: 1.21 mg/dL (ref 0.70–1.22)
Globulin: 2.2 g/dL (calc) (ref 1.9–3.7)
Glucose, Bld: 93 mg/dL (ref 65–99)
Potassium: 4.8 mmol/L (ref 3.5–5.3)
Sodium: 141 mmol/L (ref 135–146)
Total Bilirubin: 0.6 mg/dL (ref 0.2–1.2)
Total Protein: 6.4 g/dL (ref 6.1–8.1)
eGFR: 61 mL/min/{1.73_m2} (ref >=60)

## 2021-06-03 LAB — CBC WITH DIFFERENTIAL/PLATELET
Absolute Monocytes: 303 cells/uL (ref 200–950)
Basophils Absolute: 8 cells/uL (ref 0–200)
Basophils Relative: 0.2 %
Eosinophils Absolute: 21 cells/uL (ref 15–500)
Eosinophils Relative: 0.5 %
HCT: 36.1 % — ABNORMAL LOW (ref 38.5–50.0)
Hemoglobin: 11.9 g/dL — ABNORMAL LOW (ref 13.2–17.1)
Lymphs Abs: 1050 cells/uL (ref 850–3900)
MCH: 31.6 pg (ref 27.0–33.0)
MCHC: 33 g/dL (ref 32.0–36.0)
MCV: 95.8 fL (ref 80.0–100.0)
MPV: 10.4 fL (ref 7.5–12.5)
Monocytes Relative: 7.4 %
Neutro Abs: 2718 cells/uL (ref 1500–7800)
Neutrophils Relative %: 66.3 %
Platelets: 157 10*3/uL (ref 140–400)
RBC: 3.77 10*6/uL — ABNORMAL LOW (ref 4.20–5.80)
RDW: 11.7 % (ref 11.0–15.0)
Total Lymphocyte: 25.6 %
WBC: 4.1 10*3/uL (ref 3.8–10.8)

## 2021-06-03 LAB — LIPID PANEL
Cholesterol: 126 mg/dL (ref <200)
HDL: 45 mg/dL (ref >=40)
LDL Cholesterol (Calc): 65 mg/dL (calc)
Non-HDL Cholesterol (Calc): 81 mg/dL (calc) (ref <130)
Total CHOL/HDL Ratio: 2.8 (calc) (ref <5.0)
Triglycerides: 80 mg/dL (ref <150)

## 2021-08-07 ENCOUNTER — Other Ambulatory Visit: Payer: Self-pay | Admitting: Family Medicine

## 2021-10-10 DIAGNOSIS — Z961 Presence of intraocular lens: Secondary | ICD-10-CM | POA: Diagnosis not present

## 2021-10-10 DIAGNOSIS — H524 Presbyopia: Secondary | ICD-10-CM | POA: Diagnosis not present

## 2021-10-10 DIAGNOSIS — H11002 Unspecified pterygium of left eye: Secondary | ICD-10-CM | POA: Diagnosis not present

## 2022-01-31 ENCOUNTER — Other Ambulatory Visit: Payer: Self-pay | Admitting: Family Medicine

## 2022-01-31 NOTE — Telephone Encounter (Signed)
Requested Prescriptions  Pending Prescriptions Disp Refills  . traZODone (DESYREL) 50 MG tablet [Pharmacy Med Name: TRAZODONE HCL 50 MG TAB] 180 tablet 2    Sig: TAKE 2 TABLETS BY MOUTH EVERY NIGHT AT BEDTIME     Psychiatry: Antidepressants - Serotonin Modulator Failed - 01/31/2022 10:48 AM      Failed - Valid encounter within last 6 months    Recent Outpatient Visits          8 months ago Routine general medical examination at a health care facility   [place], Cammie Mcgee, MD   1 year ago Routine general medical examination at a health care facility   [place], Cammie Mcgee, MD   2 years ago Routine general medical examination at a health care facility   [place], Cammie Mcgee, MD   3 years ago Pure hypercholesterolemia   [place] Dennard Schaumann, Cammie Mcgee, MD   4 years ago Pure hypercholesterolemia   [place], Cammie Mcgee, MD             . pantoprazole ([place]) 40 MG tablet [Pharmacy Med Name: PANTOPRAZOLE SODIUM 40 MG DR TAB] 90 tablet 0    Sig: TAKE 1 TABLET BY MOUTH ONCE DAILY [place]     Gastroenterology: Proton Pump Inhibitors Failed - 01/31/2022 10:48 AM      Failed - Valid encounter within last 12 months    Recent Outpatient Visits          8 months ago Routine general medical examination at a health care facility   [place], Cammie Mcgee, MD   1 year ago Routine general medical examination at a health care facility   [place], Cammie Mcgee, MD   2 years ago Routine general medical examination at a health care facility   [place], Cammie Mcgee, MD   3 years ago Pure hypercholesterolemia   [place] Dennard Schaumann, Cammie Mcgee, MD   4 years ago Pure hypercholesterolemia   [place], Cammie Mcgee, MD             . atorvastatin  (LIPITOR) 40 MG tablet [Pharmacy Med Name: ATORVASTATIN CALCIUM 40 MG TAB] 90 tablet 0    Sig: TAKE 1 TABLET BY MOUTH ONCE DAILY     Cardiovascular:  Antilipid - Statins Failed - 01/31/2022 10:48 AM      Failed - Valid encounter within last 12 months    Recent Outpatient Visits          8 months ago Routine general medical examination at a health care facility   [place], Cammie Mcgee, MD   1 year ago Routine general medical examination at a health care facility   [place], Cammie Mcgee, MD   2 years ago Routine general medical examination at a health care facility   [place], Cammie Mcgee, MD   3 years ago Pure hypercholesterolemia   [place], Cammie Mcgee, MD   4 years ago Pure hypercholesterolemia   [place], Cammie Mcgee, MD             Failed - Lipid Panel in normal range within the last 12 months    Cholesterol  Date Value Ref Range Status  06/02/2021 126 <200 mg/dL  Final   LDL Cholesterol (Calc)  Date Value Ref Range Status  06/02/2021 65 mg/dL (calc) Final    Comment:    Reference range: <100 . Desirable range <100 mg/dL for primary prevention;   <70 mg/dL for patients with CHD or diabetic patients  with > or = 2 CHD risk factors. Marland Kitchen LDL-C is now calculated using the Martin-Hopkins  calculation, which is a validated novel method providing  better accuracy than the Friedewald equation in the  estimation of LDL-C.  Cresenciano Genre et al. Annamaria Helling. 1610;960(45): 2061-2068  (http://education.[facility].com/faq/FAQ164)    HDL  Date Value Ref Range Status  06/02/2021 45 > OR = 40 mg/dL Final   Triglycerides  Date Value Ref Range Status  06/02/2021 80 <150 mg/dL Final         Passed - Patient is not pregnant

## 2022-01-31 NOTE — Telephone Encounter (Signed)
Call to patient- daughter(DPR) scheduled appointment Courtesy RF granted Requested Prescriptions  Pending Prescriptions Disp Refills  . traZODone (DESYREL) 50 MG tablet [Pharmacy Med Name: TRAZODONE HCL 50 MG TAB] 180 tablet 2    Sig: TAKE 2 TABLETS BY MOUTH EVERY NIGHT AT BEDTIME     Psychiatry: Antidepressants - Serotonin Modulator Failed - 01/31/2022 10:48 AM      Failed - Valid encounter within last 6 months    Recent Outpatient Visits          8 months ago Routine general medical examination at a health care facility   [place], Cammie Mcgee, MD   1 year ago Routine general medical examination at a health care facility   [place], Cammie Mcgee, MD   2 years ago Routine general medical examination at a health care facility   [place], Cammie Mcgee, MD   3 years ago Pure hypercholesterolemia   [place] Dennard Schaumann, Cammie Mcgee, MD   4 years ago Pure hypercholesterolemia   [place], Cammie Mcgee, MD      Future Appointments            In 6 days [place], Cammie Mcgee, MD [place]           Signed Prescriptions Disp Refills   pantoprazole (PROTONIX) 40 MG tablet 90 tablet 0    Sig: TAKE 1 TABLET BY MOUTH ONCE DAILY [place]     Gastroenterology: Proton Pump Inhibitors Failed - 01/31/2022 10:48 AM      Failed - Valid encounter within last 12 months    Recent Outpatient Visits          8 months ago Routine general medical examination at a health care facility   [place], Cammie Mcgee, MD   1 year ago Routine general medical examination at a health care facility   [place], Cammie Mcgee, MD   2 years ago Routine general medical examination at a health care facility   [place], Cammie Mcgee, MD   3 years ago Pure hypercholesterolemia   [place] Dennard Schaumann, Cammie Mcgee, MD   4 years ago Pure hypercholesterolemia   [place], Cammie Mcgee, MD      Future Appointments            In 6 days Denn[place] Schaumann, Cammie Mcgee, MD [place]            atorvastatin (LIPITOR) 40 MG tablet 90 tablet 0    Sig: TAKE 1 TABLET BY MOUTH ONCE DAILY     Cardiovascular:  Antilipid - Statins Failed - 01/31/2022 10:48 AM      Failed - Valid encounter within last 12 months    Recent Outpatient Visits          8 months ago Routine general medical examination at a health care facility   [place], Cammie Mcgee, MD   1 year ago Routine general medical examination at a health care facility   [place], Cammie Mcgee, MD   2 years ago Routine general medical examination at a health care facility   [place], Cammie Mcgee, MD   3 years ago Pure hypercholesterolemia   [place] Pickard, Cammie Mcgee, MD   4  years ago Pure hypercholesterolemia   [place], Cammie Mcgee, MD      Future Appointments            In 6 days Denn[place] Schaumann, Cammie Mcgee, MD [place]           Failed - Lipid Panel in normal range within the last 12 months    Cholesterol  Date Value Ref Range Status  06/02/2021 126 <200 mg/dL Final   LDL Cholesterol (Calc)  Date Value Ref Range Status  06/02/2021 65 mg/dL (calc) Final    Comment:    Reference range: <100 . Desirable range <100 mg/dL for primary prevention;   <70 mg/dL for patients with CHD or diabetic patients  with > or = 2 CHD risk factors. Marland Kitchen LDL-C is now calculated using the Martin-Hopkins  calculation, which is a validated novel method providing  better accuracy than the Friedewald equation in the  estimation of LDL-C.  Cresenciano Genre et al. Annamaria Helling. 2841;324(40): 2061-2068  (http://education.[facility].com/faq/FAQ164)    HDL  Date Value Ref  Range Status  06/02/2021 45 > OR = 40 mg/dL Final   Triglycerides  Date Value Ref Range Status  06/02/2021 80 <150 mg/dL Final         Passed - Patient is not pregnant

## 2022-02-06 ENCOUNTER — Ambulatory Visit (INDEPENDENT_AMBULATORY_CARE_PROVIDER_SITE_OTHER): Payer: Medicare Other | Admitting: Family Medicine

## 2022-02-06 ENCOUNTER — Encounter: Payer: Self-pay | Admitting: Family Medicine

## 2022-02-06 VITALS — BP 122/62 | HR 68 | Temp 97.7°F | Ht 73.0 in | Wt 148.8 lb

## 2022-02-06 DIAGNOSIS — I10 Essential (primary) hypertension: Secondary | ICD-10-CM | POA: Diagnosis not present

## 2022-02-06 NOTE — Progress Notes (Signed)
Subjective:    Patient ID: Albert Garner, male    DOB: 30-Sep-1940, 81 y.o.   MRN: [id number]  HPI  Patient is here today for follow-up of hypertension and hyperlipidemia.  He is currently taking atorvastatin and lisinopril.  He denies any chest pain shortness of breath or dyspnea on exertion.  He denies any myalgias or right upper quadrant pain.  He denies any dizziness or lightheadedness or syncope.  He has lost 3 pounds however he works outside and I suspect that some of this is due to sweating and dehydration.  I encouraged him to drink more water given how hot it is outside. Past Medical History:  Diagnosis Date   Allergy    occasional seasonal allergies   Carotid artery occlusion    Hypercholesteremia    Hyperlipidemia    Hypertension    Past Surgical History:  Procedure Laterality Date   COLONOSCOPY     2003 w/santagade-no report in epic   COLONOSCOPY  01/2015   Dr. Ardis Hughs: four polyps removed, 3 tubular adenomas. next tcs 3 years   COLONOSCOPY N/A 01/31/2018   diverticulosis, internal hemorrhoids.    ESOPHAGOGASTRODUODENOSCOPY N/A 03/09/2017   Dr. Gala Romney: Distal esophagus quite macerated, Schatzki ring not dilated, retained food in the hernia sac.   ESOPHAGOGASTRODUODENOSCOPY N/A 04/11/2017   Dr. 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   FOREIGN BODY REMOVAL N/A 03/09/2017   Procedure: FOREIGN BODY REMOVAL;  Surgeon: Daneil Dolin, MD;  [facility]: [facility];  Service: Endoscopy;  Laterality: N/A;   INGUINAL HERNIA REPAIR Right 1986   MALONEY DILATION N/A 04/11/2017   Procedure: MALONEY DILATION;  Surgeon: Daneil Dolin, MD;  [facility]: [facility];  Service: Endoscopy;  Laterality: N/A;   MALONEY DILATION N/A  11/13/2019   Procedure: [name] DILATION;  Surgeon: Daneil Dolin, MD;  Location: [facility];  Service: Endoscopy;  Laterality: N/A;   MOUTH SURGERY     with sedation-bottom teeth pulled   Current Outpatient Medications on File Prior to Visit  Medication Sig Dispense Refill   aspirin 81 MG tablet Take 81 mg by mouth daily.     atorvastatin (LIPITOR) 40 MG tablet TAKE 1 TABLET BY MOUTH ONCE DAILY 90 tablet 0   lisinopril (ZESTRIL) 10 MG tablet TAKE 1 TABLET BY MOUTH ONCE DAILY 90 tablet 3   Nutritional Supplements (ENSURE ORIGINAL PO) Take 237 mLs by mouth 3 (three) times daily as needed (nutritional support). Drinks 1-3 daily     pantoprazole (PROTONIX) 40 MG tablet TAKE 1 TABLET BY MOUTH ONCE DAILY 30 MINUTES BEFORE BREAKFAST 90 tablet 0   traZODone (DESYREL) 50 MG tablet TAKE 2 TABLETS BY MOUTH EVERY NIGHT AT BEDTIME 60 tablet 0   triamcinolone cream (KENALOG) 0.1 % Apply 1 application topically 2 (two) times daily. 30 g 1   No current facility-administered medications on file prior to visit.   No Known Allergies Social History   Socioeconomic History   Marital status: Married    Spouse name: Not on file   Number of children: Not on file   Years of education: Not on file   Highest education level: Not on file  Occupational History   Not on file  Tobacco Use  Smoking status: Never   Smokeless tobacco: Current    Types: Chew  Vaping Use   Vaping Use: Never used  Substance and Sexual Activity   Alcohol use: No   Drug use: No   Sexual activity: Yes  Other Topics Concern   Not on file  Social History Narrative   Not on file   Social Determinants of Health   Financial Resource Strain: Not on file  Food Insecurity: Not on file  Transportation Needs: Not on file  Physical Activity: Not on file  Stress: Not on file  Social Connections: Not on file  Intimate Partner Violence: Not on file   Family History  Problem Relation Age of Onset   Heart disease Mother     Throat cancer Brother    Colon cancer Neg Hx    Rectal cancer Neg Hx    Stomach cancer Neg Hx       Review of Systems  All other systems reviewed and are negative.      Objective:   Physical Exam Vitals reviewed.  Constitutional:      General: He is not in acute distress.    Appearance: He is well-developed. He is not diaphoretic.  HENT:     Head: Normocephalic and atraumatic.     Right Ear: External ear normal.     Left Ear: External ear normal.     Nose: Nose normal.     Mouth/Throat:     Pharynx: No oropharyngeal exudate.  Eyes:     General: No scleral icterus.       Right eye: No discharge.        Left eye: No discharge.     Conjunctiva/sclera: Conjunctivae normal.     Pupils: Pupils are equal, round, and reactive to light.  Neck:     Thyroid: No thyromegaly.     Vascular: No JVD.     Trachea: No tracheal deviation.  Cardiovascular:     Rate and Rhythm: Normal rate and regular rhythm.     Heart sounds: Normal heart sounds. No murmur heard.    No friction rub. No gallop.  Pulmonary:     Effort: Pulmonary effort is normal. No respiratory distress.     Breath sounds: Normal breath sounds. No stridor. No wheezing or rales.  Chest:     Chest wall: No tenderness.  Abdominal:     General: Bowel sounds are normal. There is no distension.     Palpations: Abdomen is soft. There is no mass.     Tenderness: There is no abdominal tenderness. There is no guarding or rebound.  Musculoskeletal:        General: No tenderness. Normal range of motion.     Cervical back: Normal range of motion and neck supple.  Lymphadenopathy:     Cervical: No cervical adenopathy.  Skin:    General: Skin is warm.     Coloration: Skin is not pale.     Findings: No erythema or rash.  Neurological:     Mental Status: He is alert and oriented to person, place, and time.     Cranial Nerves: No cranial nerve deficit.     Motor: No abnormal muscle tone.     Coordination: Coordination normal.      Deep Tendon Reflexes: Reflexes are normal and symmetric.  Psychiatric:        Behavior: Behavior normal.        Thought Content: Thought content normal.  Judgment: Judgment normal.         Assessment & Plan:  Benign essential HTN - Plan: CBC with Differential/Platelet, Lipid panel, COMPLETE METABOLIC PANEL WITH GFR Patient's blood pressure is outstanding.  Check CBC CMP and a lipid panel.  Goal LDL cholesterol is less than 70.  Monitor renal function and potassium on lisinopril.  Monitor LFTs on statin.

## 2022-02-07 LAB — COMPLETE METABOLIC PANEL WITH GFR
AG Ratio: 1.5 (calc) (ref 1.0–2.5)
ALT: 11 U/L (ref 9–46)
AST: 17 U/L (ref 10–35)
Albumin: 4 g/dL (ref 3.6–5.1)
Alkaline phosphatase (APISO): 73 U/L (ref 35–144)
BUN/Creatinine Ratio: 15 (calc) (ref 6–22)
BUN: 18 mg/dL (ref 7–25)
CO2: 30 mmol/L (ref 20–32)
Calcium: 9.3 mg/dL (ref 8.6–10.3)
Chloride: 103 mmol/L (ref 98–110)
Creat: 1.23 mg/dL — ABNORMAL HIGH (ref 0.70–1.22)
Globulin: 2.7 g/dL (calc) (ref 1.9–3.7)
Glucose, Bld: 118 mg/dL — ABNORMAL HIGH (ref 65–99)
Potassium: 4.7 mmol/L (ref 3.5–5.3)
Sodium: 139 mmol/L (ref 135–146)
Total Bilirubin: 0.6 mg/dL (ref 0.2–1.2)
Total Protein: 6.7 g/dL (ref 6.1–8.1)
eGFR: 59 mL/min/{1.73_m2} — ABNORMAL LOW (ref >=60)

## 2022-02-07 LAB — CBC WITH DIFFERENTIAL/PLATELET
Absolute Monocytes: 287 cells/uL (ref 200–950)
Basophils Absolute: 8 cells/uL (ref 0–200)
Basophils Relative: 0.2 %
Eosinophils Absolute: 29 cells/uL (ref 15–500)
Eosinophils Relative: 0.7 %
HCT: 36.4 % — ABNORMAL LOW (ref 38.5–50.0)
Hemoglobin: 11.9 g/dL — ABNORMAL LOW (ref 13.2–17.1)
Lymphs Abs: 1013 cells/uL (ref 850–3900)
MCH: 31.3 pg (ref 27.0–33.0)
MCHC: 32.7 g/dL (ref 32.0–36.0)
MCV: 95.8 fL (ref 80.0–100.0)
MPV: 10.8 fL (ref 7.5–12.5)
Monocytes Relative: 7 %
Neutro Abs: 2763 cells/uL (ref 1500–7800)
Neutrophils Relative %: 67.4 %
Platelets: 142 10*3/uL (ref 140–400)
RBC: 3.8 10*6/uL — ABNORMAL LOW (ref 4.20–5.80)
RDW: 11.9 % (ref 11.0–15.0)
Total Lymphocyte: 24.7 %
WBC: 4.1 10*3/uL (ref 3.8–10.8)

## 2022-02-07 LAB — LIPID PANEL
Cholesterol: 127 mg/dL (ref <200)
HDL: 47 mg/dL (ref >=40)
LDL Cholesterol (Calc): 66 mg/dL (calc)
Non-HDL Cholesterol (Calc): 80 mg/dL (calc) (ref <130)
Total CHOL/HDL Ratio: 2.7 (calc) (ref <5.0)
Triglycerides: 68 mg/dL (ref <150)

## 2022-03-01 ENCOUNTER — Other Ambulatory Visit: Payer: Self-pay | Admitting: Family Medicine

## 2022-03-01 NOTE — Telephone Encounter (Signed)
Requested Prescriptions  Pending Prescriptions Disp Refills  . traZODone (DESYREL) 50 MG tablet [Pharmacy Med Name: TRAZODONE HCL 50 MG TAB] 180 tablet 1    Sig: TAKE 2 TABLETS BY MOUTH EVERY NIGHT AT BEDTIME     Psychiatry: Antidepressants - Serotonin Modulator Failed - 03/01/2022  9:49 AM      Failed - Valid encounter within last 6 months    Recent Outpatient Visits          9 months ago Routine general medical examination at a health care facility   [place], Cammie Mcgee, MD   1 year ago Routine general medical examination at a health care facility   [place], Cammie Mcgee, MD   2 years ago Routine general medical examination at a health care facility   [place], Cammie Mcgee, MD   3 years ago Pure hypercholesterolemia   [place] Dennard Schaumann, Cammie Mcgee, MD   4 years ago Pure hypercholesterolemia   [place], Cammie Mcgee, MD      Future Appointments            In 5 months Pickard, Cammie Mcgee, MD [place]

## 2022-04-27 DIAGNOSIS — H903 Sensorineural hearing loss, bilateral: Secondary | ICD-10-CM | POA: Diagnosis not present

## 2022-05-02 DIAGNOSIS — H903 Sensorineural hearing loss, bilateral: Secondary | ICD-10-CM | POA: Insufficient documentation

## 2022-05-18 ENCOUNTER — Other Ambulatory Visit: Payer: Self-pay

## 2022-05-18 ENCOUNTER — Telehealth: Payer: Self-pay | Admitting: Family Medicine

## 2022-05-18 DIAGNOSIS — I10 Essential (primary) hypertension: Secondary | ICD-10-CM

## 2022-05-18 MED ORDER — LISINOPRIL 10 MG PO TABS: 10.0000 mg | ORAL_TABLET | Freq: Every day | ORAL | 3 refills | Status: DC

## 2022-05-18 NOTE — Telephone Encounter (Signed)
Received call from [place] at [facility] to request refill of  lisinopril (ZESTRIL) 10 MG tablet [[id number]]   Dose, Route, Frequency: As Directed  Dispense Quantity: 90 tablet Refills: 3        Sig: TAKE 1 TABLET BY MOUTH ONCE DAILY       Start Date: 05/02/21 End Date: --  Written Date: 05/02/21 Expiration Date: 05/02/22     Associated Diagnoses: Benign essential HTN [I10]  Original Order:  lisinopril (ZESTRIL) 10 MG tablet [[id number]]    Patient's script at [facility] has expired; he wants script sent to [facility].  Pharmacy:   [address], [place] [address]  Patient currently out of medication; requesting refill asap.   Please advise pharmacist at [phone], alt # [phone].

## 2022-05-19 ENCOUNTER — Other Ambulatory Visit: Payer: Self-pay | Admitting: Family Medicine

## 2022-05-19 DIAGNOSIS — I10 Essential (primary) hypertension: Secondary | ICD-10-CM

## 2022-05-21 ENCOUNTER — Other Ambulatory Visit: Payer: Self-pay

## 2022-05-21 DIAGNOSIS — K224 Dyskinesia of esophagus: Secondary | ICD-10-CM

## 2022-05-21 DIAGNOSIS — R1319 Other dysphagia: Secondary | ICD-10-CM

## 2022-05-21 DIAGNOSIS — I10 Essential (primary) hypertension: Secondary | ICD-10-CM

## 2022-05-21 DIAGNOSIS — E7800 Pure hypercholesterolemia, unspecified: Secondary | ICD-10-CM

## 2022-05-21 MED ORDER — LISINOPRIL 10 MG PO TABS: 10.0000 mg | ORAL_TABLET | Freq: Every day | ORAL | 3 refills | Status: DC

## 2022-05-21 MED ORDER — PANTOPRAZOLE SODIUM 40 MG PO TBEC: DELAYED_RELEASE_TABLET | ORAL | 0 refills | Status: DC

## 2022-05-21 MED ORDER — ATORVASTATIN CALCIUM 40 MG PO TABS: 40.0000 mg | ORAL_TABLET | Freq: Every day | ORAL | 0 refills | Status: DC

## 2022-05-21 NOTE — Telephone Encounter (Signed)
Unable to refill per protocol, last refill by provider 05/18/22. Will refuse duplicate request.  Requested Prescriptions  Pending Prescriptions Disp Refills  . lisinopril (ZESTRIL) 10 MG tablet [Pharmacy Med Name: LISINOPRIL 10 MG TABLETS] 90 tablet 0    Sig: TAKE (1) TABLET BY MOUTH ONCE DAILY.     Cardiovascular:  ACE Inhibitors Failed - 05/19/2022  3:14 PM      Failed - Cr in normal range and within 180 days    Creat  Date Value Ref Range Status  02/06/2022 1.23 (H) 0.70 - 1.22 mg/dL Final         Failed - Valid encounter within last 6 months    Recent Outpatient Visits          11 months ago Routine general medical examination at a health care facility   [place], Cammie Mcgee, MD   1 year ago Routine general medical examination at a health care facility   [place], Cammie Mcgee, MD   2 years ago Routine general medical examination at a health care facility   [place], Cammie Mcgee, MD   4 years ago Pure hypercholesterolemia   [place], Cammie Mcgee, MD   5 years ago Pure hypercholesterolemia   [place], Cammie Mcgee, MD      Future Appointments            In 2 months Pickard, Cammie Mcgee, MD [place], [place] in normal range and within 180 days    Potassium  Date Value Ref Range Status  02/06/2022 4.7 3.5 - 5.3 mmol/L Final         Passed - Patient is not pregnant      Passed - Last BP in normal range    BP Readings from Last 1 Encounters:  02/06/22 122/62

## 2022-06-21 ENCOUNTER — Encounter: Payer: Self-pay | Admitting: Family Medicine

## 2022-06-21 ENCOUNTER — Ambulatory Visit (INDEPENDENT_AMBULATORY_CARE_PROVIDER_SITE_OTHER): Payer: Medicare Other

## 2022-06-21 VITALS — Ht 73.0 in | Wt 148.0 lb

## 2022-06-21 DIAGNOSIS — Z0000 Encounter for general adult medical examination without abnormal findings: Secondary | ICD-10-CM

## 2022-06-21 NOTE — Patient Instructions (Addendum)
Albert Garner , Thank you for taking time to come for your Medicare Wellness Visit. I appreciate your ongoing commitment to your health goals. Please review the following plan we discussed and let me know if I can assist you in the future.   These are the goals we discussed:  Goals      Patient Stated     Maintain independence         This is a list of the screening recommended for you and due dates:  Health Maintenance  Topic Date Due   COVID-19 Vaccine (3 - 2023-24 season) 07/07/2022*   Zoster (Shingles) Vaccine (1 of 2) 09/20/2022*   Flu Shot  10/21/2022*   Colon Cancer Screening  12/20/2022*   Pneumonia Vaccine (2 - PPSV23 or PCV20) 06/22/2023*   Medicare Annual Wellness Visit  06/22/2023   DTaP/Tdap/Td vaccine (2 - Td or Tdap) 05/27/2030   HPV Vaccine  Aged Out  *Topic was postponed. The date shown is not the original due date.    Advanced directives: Please bring a copy of your health care power of attorney and living will to the office to be added to your chart at your convenience.   Conditions/risks identified: Wears rx glasses - up to date with routine eye exams with    Next appointment: Follow up in one year for your annual wellness visit.   The number for Dr. Roseanne Kaufman office (Gastroenterology)  [phone]  Preventive Care 9 Years and Older, Male  Preventive care refers to lifestyle choices and visits with your health care provider that can promote health and wellness. What does preventive care include? A yearly physical exam. This is also called an annual well check. Dental exams once or twice a year. Routine eye exams. Ask your health care provider how often you should have your eyes checked. Personal lifestyle choices, including: Daily care of your teeth and gums. Regular physical activity. Eating a healthy diet. Avoiding tobacco and drug use. Limiting alcohol use. Practicing safe sex. Taking low doses of aspirin every day. Taking vitamin and mineral  supplements as recommended by your health care provider. What happens during an annual well check? The services and screenings done by your health care provider during your annual well check will depend on your age, overall health, lifestyle risk factors, and family history of disease. Counseling  Your health care provider may ask you questions about your: Alcohol use. Tobacco use. Drug use. Emotional well-being. Home and relationship well-being. Sexual activity. Eating habits. History of falls. Memory and ability to understand (cognition). Work and work Statistician. Screening  You may have the following tests or measurements: Height, weight, and BMI. Blood pressure. Lipid and cholesterol levels. These may be checked every 5 years, or more frequently if you are over 73 years old. Skin check. Lung cancer screening. You may have this screening every year starting at age 62 if you have a 30-pack-year history of smoking and currently smoke or have quit within the past 15 years. Fecal occult blood test (FOBT) of the stool. You may have this test every year starting at age 30. Flexible sigmoidoscopy or colonoscopy. You may have a sigmoidoscopy every 5 years or a colonoscopy every 10 years starting at age 48. Prostate cancer screening. Recommendations will vary depending on your family history and other risks. Hepatitis C blood test. Hepatitis B blood test. Sexually transmitted disease (STD) testing. Diabetes screening. This is done by checking your blood sugar (glucose) after you have not eaten for a while (  fasting). You may have this done every 1-3 years. Abdominal aortic aneurysm (AAA) screening. You may need this if you are a current or former smoker. Osteoporosis. You may be screened starting at age 44 if you are at high risk. Talk with your health care provider about your test results, treatment options, and if necessary, the need for more tests. Vaccines  Your health care provider  may recommend certain vaccines, such as: Influenza vaccine. This is recommended every year. Tetanus, diphtheria, and acellular pertussis (Tdap, Td) vaccine. You may need a Td booster every 10 years. Zoster vaccine. You may need this after age 60. Pneumococcal 13-valent conjugate (PCV13) vaccine. One dose is recommended after age 48. Pneumococcal polysaccharide (PPSV23) vaccine. One dose is recommended after age 74. Talk to your health care provider about which screenings and vaccines you need and how often you need them. This information is not intended to replace advice given to you by your health care provider. Make sure you discuss any questions you have with your health care provider. Document Released: 08/05/2015 Document Revised: 03/28/2016 Document Reviewed: 05/10/2015 Elsevier Interactive Patient Education  2017 [place] Prevention in the Home Falls can cause injuries. They can happen to people of all ages. There are many things you can do to make your home safe and to help prevent falls. What can I do on the outside of my home? Regularly fix the edges of walkways and driveways and fix any cracks. Remove anything that might make you trip as you walk through a door, such as a raised step or threshold. Trim any bushes or trees on the path to your home. Use bright outdoor lighting. Clear any walking paths of anything that might make someone trip, such as rocks or tools. Regularly check to see if handrails are loose or broken. Make sure that both sides of any steps have handrails. Any raised decks and porches should have guardrails on the edges. Have any leaves, snow, or ice cleared regularly. Use sand or salt on walking paths during winter. Clean up any spills in your garage right away. This includes oil or grease spills. What can I do in the bathroom? Use night lights. Install grab bars by the toilet and in the tub and shower. Do not use towel bars as grab bars. Use  non-skid mats or decals in the tub or shower. If you need to sit down in the shower, use a plastic, non-slip stool. Keep the floor dry. Clean up any water that spills on the floor as soon as it happens. Remove soap buildup in the tub or shower regularly. Attach bath mats securely with double-sided non-slip rug tape. Do not have throw rugs and other things on the floor that can make you trip. What can I do in the bedroom? Use night lights. Make sure that you have a light by your bed that is easy to reach. Do not use any sheets or blankets that are too big for your bed. They should not hang down onto the floor. Have a firm chair that has side arms. You can use this for support while you get dressed. Do not have throw rugs and other things on the floor that can make you trip. What can I do in the kitchen? Clean up any spills right away. Avoid walking on wet floors. Keep items that you use a lot in easy-to-reach places. If you need to reach something above you, use a strong step stool that has a grab bar.  Keep electrical cords out of the way. Do not use floor polish or wax that makes floors slippery. If you must use wax, use non-skid floor wax. Do not have throw rugs and other things on the floor that can make you trip. What can I do with my stairs? Do not leave any items on the stairs. Make sure that there are handrails on both sides of the stairs and use them. Fix handrails that are broken or loose. Make sure that handrails are as long as the stairways. Check any carpeting to make sure that it is firmly attached to the stairs. Fix any carpet that is loose or worn. Avoid having throw rugs at the top or bottom of the stairs. If you do have throw rugs, attach them to the floor with carpet tape. Make sure that you have a light switch at the top of the stairs and the bottom of the stairs. If you do not have them, ask someone to add them for you. What else can I do to help prevent falls? Wear  shoes that: Do not have high heels. Have rubber bottoms. Are comfortable and fit you well. Are closed at the toe. Do not wear sandals. If you use a stepladder: Make sure that it is fully opened. Do not climb a closed stepladder. Make sure that both sides of the stepladder are locked into place. Ask someone to hold it for you, if possible. Clearly mark and make sure that you can see: Any grab bars or handrails. First and last steps. Where the edge of each step is. Use tools that help you move around (mobility aids) if they are needed. These include: Canes. Walkers. Scooters. Crutches. Turn on the lights when you go into a dark area. Replace any light bulbs as soon as they burn out. Set up your furniture so you have a clear path. Avoid moving your furniture around. If any of your floors are uneven, fix them. If there are any pets around you, be aware of where they are. Review your medicines with your doctor. Some medicines can make you feel dizzy. This can increase your chance of falling. Ask your doctor what other things that you can do to help prevent falls. This information is not intended to replace advice given to you by your health care provider. Make sure you discuss any questions you have with your health care provider. Document Released: 05/05/2009 Document Revised: 12/15/2015 Document Reviewed: 08/13/2014 Elsevier Interactive Patient Education  2017 [employer].

## 2022-06-21 NOTE — Progress Notes (Signed)
Subjective:   Albert Garner is a 81 y.o. male who presents for Medicare Annual/Subsequent preventive examination.  I connected with  Albert Garner on 06/21/22 by a audio enabled telemedicine application and verified that I am speaking with the correct person using two identifiers.  Patient Location: Home  Provider Location: Office/Clinic  I discussed the limitations of evaluation and management by telemedicine. The patient expressed understanding and agreed to proceed.  Review of Systems     Cardiac Risk Factors include: advanced age (>86mn, >>82women);male gender;dyslipidemia     Objective:    Today's Vitals   06/21/22 1015  Weight: 148 lb (67.1 kg)  Height: '6\' 1"'$  (1.854 m)   Body mass index is 19.53 kg/m.     06/21/2022   10:28 AM 06/02/2021    8:04 AM 05/26/2020    8:06 AM 11/13/2019   11:02 AM 01/31/2018    6:30 AM 04/30/2017    3:30 PM 04/11/2017    2:15 PM  Advanced Directives  Does Patient Have a Medical Advance Directive? Yes Yes Yes Yes Yes Yes Yes  Type of Advance Directive Living will;Healthcare Power of Attorney Living will [place];Out of facility DNR (pink MOST or yellow form) Living will [place] will Living will  Does patient want to make changes to medical advance directive? No - Patient declined No - Patient declined       Copy of [place]? No - copy requested    No - copy requested  No - copy requested    Current Medications (verified) Outpatient Encounter Medications as of 06/21/2022  Medication Sig   aspirin 81 MG tablet Take 81 mg by mouth daily.   atorvastatin (LIPITOR) 40 MG tablet Take 1 tablet (40 mg total) by mouth daily.   lisinopril (ZESTRIL) 10 MG tablet Take 1 tablet (10 mg total) by mouth daily.   Nutritional Supplements (ENSURE ORIGINAL PO) Take 237 mLs by mouth 3 (three) times daily as needed (nutritional  support). Drinks 1-3 daily   pantoprazole (PROTONIX) 40 MG tablet TAKE 1 TABLET BY MOUTH ONCE DAILY 30 MINUTES BEFORE BREAKFAST   traZODone (DESYREL) 50 MG tablet TAKE 2 TABLETS BY MOUTH EVERY NIGHT AT BEDTIME   triamcinolone cream (KENALOG) 0.1 % Apply 1 application topically 2 (two) times daily.   No facility-administered encounter medications on file as of 06/21/2022.    Allergies (verified) Patient has no known allergies.   History: Past Medical History:  Diagnosis Date   Allergy    occasional seasonal allergies   Carotid artery occlusion    Hypercholesteremia    Hyperlipidemia    Hypertension    Past Surgical History:  Procedure Laterality Date   COLONOSCOPY     2003 w/santagade-no report in epic   COLONOSCOPY  01/2015   Dr. JArdis Hughs four polyps removed, 3 tubular adenomas. next tcs 3 years   COLONOSCOPY N/A 01/31/2018   diverticulosis, internal hemorrhoids.    ESOPHAGOGASTRODUODENOSCOPY N/A 03/09/2017   Dr. RGala Romney Distal esophagus quite macerated, Schatzki ring not dilated, retained food in the hernia sac.   ESOPHAGOGASTRODUODENOSCOPY N/A 04/11/2017   Dr. 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   left hand 3 rd digit   FOREIGN  BODY REMOVAL N/A 03/09/2017   Procedure: FOREIGN BODY REMOVAL;  Surgeon: Daneil Dolin, MD;  Location: [facility];  Service: Endoscopy;  Laterality: N/A;   INGUINAL HERNIA REPAIR Right 1986   MALONEY DILATION N/A 04/11/2017   Procedure: MALONEY DILATION;  Surgeon: Daneil Dolin, MD;  Location: [facility];  Service: Endoscopy;  Laterality: N/A;   MALONEY DILATION N/A 11/13/2019   Procedure: [name] DILATION;  Surgeon: Daneil Dolin, MD;  Location: [facility];  Service: Endoscopy;  Laterality: N/A;    MOUTH SURGERY     with sedation-bottom teeth pulled   Family History  Problem Relation Age of Onset   Heart disease Mother    Throat cancer Brother    Colon cancer Neg Hx    Rectal cancer Neg Hx    Stomach cancer Neg Hx    Social History   Socioeconomic History   Marital status: Married    Spouse name: Not on file   Number of children: Not on file   Years of education: Not on file   Highest education level: Not on file  Occupational History   Not on file  Tobacco Use   Smoking status: Never   Smokeless tobacco: Current    Types: Chew  Vaping Use   Vaping Use: Never used  Substance and Sexual Activity   Alcohol use: No   Drug use: No   Sexual activity: Yes  Other Topics Concern   Not on file  Social History Narrative   Not on file   Social Determinants of Health   Financial Resource Strain: Low Risk  (06/21/2022)   Overall Financial Resource Strain (CARDIA)    Difficulty of Paying Living Expenses: Not hard at all  Food Insecurity: No Food Insecurity (06/21/2022)   Hunger Vital Sign    Worried About Running Out of Food in the Last Year: Never true    [place] in the Last Year: Never true  Transportation Needs: No Transportation Needs (06/21/2022)   PRAPARE - Hydrologist (Medical): No    Lack of Transportation (Non-Medical): No  Physical Activity: Inactive (06/21/2022)   Exercise Vital Sign    Days of Exercise per Week: 0 days    Minutes of Exercise per Session: 0 min  Stress: No Stress Concern Present (06/21/2022)   [place]    Feeling of Stress : Not at all  Social Connections: Moderately Integrated (06/21/2022)   Social Connection and Isolation Panel [NHANES]    Frequency of Communication with Friends and Family: More than three times a week    Frequency of Social Gatherings with Friends and Family: Three times a week    Attends Religious Services: More  than 4 times per year    Active Member of Clubs or Organizations: No    Attends Archivist Meetings: Never    Marital Status: Married    Tobacco Counseling Ready to quit: Not Answered Counseling given: Not Answered   Clinical Intake:  Pre-visit preparation completed: Yes  Pain : No/denies pain  Diabetes: No  How often do you need to have someone help you when you read instructions, pamphlets, or other written materials from your doctor or pharmacy?: 1 - Never  Diabetic?No  Interpreter Needed?: No  Information entered by :: Denman George LPN   Activities of Daily Living    06/21/2022   10:28 AM  In your present state of  health, do you have any difficulty performing the following activities:  Hearing? 1  Comment bilateral hearing aids  Vision? 0  Difficulty concentrating or making decisions? 0  Walking or climbing stairs? 0  Dressing or bathing? 0  Doing errands, shopping? 0  Preparing Food and eating ? N  Using the Toilet? N  In the past six months, have you accidently leaked urine? N  Do you have problems with loss of bowel control? N  Managing your Medications? N  Managing your Finances? N  Housekeeping or managing your Housekeeping? N    Patient Care Team: [name], MD as PCP - General (Family Medicine) [place], [facility] Gala Romney, Cristopher Estimable, MD as Consulting Physician (Gastroenterology)  Indicate any recent Medical Services you may have received from other than [facility] providers in the past year (date may be approximate).     Assessment:   This is a routine wellness examination for Albert Garner.  Hearing/Vision screen Hearing Screening - Comments:: Wears bilateral hearing aids; followed by audiology  Vision Screening - Comments:: Up to date with routine eye exams with Dr. Gershon Crane   Dietary issues and exercise activities discussed: Current Exercise Habits: The patient does not participate in regular exercise at present   Goals Addressed              This Visit's Progress    Patient Stated       Maintain independence        Depression Screen    06/21/2022   10:26 AM 06/02/2021    8:02 AM 05/26/2020    8:07 AM 05/22/2018    9:34 AM 05/20/2017    8:54 AM 05/20/2017    8:23 AM 05/18/2016    9:00 AM  PHQ 2/9 Scores  PHQ - 2 Score 0 0 0 0 0 0 0  PHQ- 9 Score  4         Fall Risk    06/21/2022   10:25 AM 06/02/2021    8:02 AM 05/26/2020    8:07 AM 05/22/2018    9:34 AM 05/20/2017    8:54 AM  Fall Risk   Falls in the past year? 0 0 0 No No  Number falls in past yr: 0 0 0    Injury with Fall? 0 0 0    Risk for fall due to :  No Fall Risks     Follow up Falls evaluation completed;Education provided;Falls prevention discussed Falls evaluation completed       FALL RISK PREVENTION PERTAINING TO THE HOME:  Any stairs in or around the home? No  If so, are there any without handrails?  N/a Home free of loose throw rugs in walkways, pet beds, electrical cords, etc? Yes  Adequate lighting in your home to reduce risk of falls? Yes   ASSISTIVE DEVICES UTILIZED TO PREVENT FALLS:  Life alert? No  Use of a cane, walker or w/c? No  Grab bars in the bathroom? Yes  Shower chair or bench in shower? No  Elevated toilet seat or a handicapped toilet? No   TIMED UP AND GO:  Was the test performed? No .  Length of time to ambulate 10 feet: telephonic visit  Cognitive Function:        06/21/2022   10:29 AM 05/26/2020    8:08 AM  6CIT Screen  What Year? 0 points 0 points  What month? 0 points 0 points  What time? 0 points 0 points  Count back from 20 0  points 0 points  Months in reverse 2 points 0 points  Repeat phrase 0 points 0 points  Total Score 2 points 0 points    Immunizations Immunization History  Administered Date(s) Administered   PFIZER(Purple Top)SARS-COV-2 Vaccination 03/09/2020, 03/30/2020   Pneumococcal Conjugate-13 10/27/2015   Tdap 05/27/2020    TDAP status: Up to date  Flu Vaccine  status: Declined, Education has been provided regarding the importance of this vaccine but patient still declined. Advised may receive this vaccine at local pharmacy or Health Dept. Aware to provide a copy of the vaccination record if obtained from local pharmacy or Health Dept. Verbalized acceptance and understanding.  Pneumococcal vaccine status: Declined,  Education has been provided regarding the importance of this vaccine but patient still declined. Advised may receive this vaccine at local pharmacy or Health Dept. Aware to provide a copy of the vaccination record if obtained from local pharmacy or Health Dept. Verbalized acceptance and understanding.   Covid-19 vaccine status: Information provided on how to obtain vaccines.   Qualifies for Shingles Vaccine? Yes   Zostavax completed No   Shingrix Completed?: No.    Education has been provided regarding the importance of this vaccine. Patient has been advised to call insurance company to determine out of pocket expense if they have not yet received this vaccine. Advised may also receive vaccine at local pharmacy or Health Dept. Verbalized acceptance and understanding.  Screening Tests Health Maintenance  Topic Date Due   COVID-19 Vaccine (3 - 2023-24 season) 07/07/2022 (Originally 03/23/2022)   Zoster Vaccines- Shingrix (1 of 2) 09/20/2022 (Originally 01/01/1991)   INFLUENZA VACCINE  10/21/2022 (Originally 02/20/2022)   COLONOSCOPY (Pts 45-17yr Insurance coverage will need to be confirmed)  12/20/2022 (Originally 01/31/2021)   Pneumonia Vaccine 81 Years old (2 - PPSV23 or PCV20) 06/22/2023 (Originally 10/26/2016)   Medicare Annual Wellness (AWV)  06/22/2023   DTaP/Tdap/Td (2 - Td or Tdap) 05/27/2030   HPV VACCINES  Aged Out    Health Maintenance  There are no preventive care reminders to display for this patient.   Colorectal cancer screening: No longer required.   Lung Cancer Screening: (Low Dose CT Chest recommended if Age 81-80years,  30 pack-year currently smoking OR have quit w/in 15years.) does not qualify.   Lung Cancer Screening Referral: n/a   Additional Screening:  Hepatitis C Screening: does not qualify  Vision Screening: Recommended annual ophthalmology exams for early detection of glaucoma and other disorders of the eye. Is the patient up to date with their annual eye exam?  Yes  Who is the provider or what is the name of the office in which the patient attends annual eye exams? Dr. SGershon Crane If pt is not established with a provider, would they like to be referred to a provider to establish care?  N/a  .   Dental Screening: Recommended annual dental exams for proper oral hygiene  Community Resource Referral / Chronic Care Management: CRR required this visit?  No   CCM required this visit?  No      Plan:     I have personally reviewed and noted the following in the patient's chart:   Medical and social history Use of alcohol, tobacco or illicit drugs  Current medications and supplements including opioid prescriptions. Patient is not currently taking opioid prescriptions. Functional ability and status Nutritional status Physical activity Advanced directives List of other physicians Hospitalizations, surgeries, and ER visits in previous 12 months Vitals Screenings to include cognitive, depression, and falls  Referrals and appointments  In addition, I have reviewed and discussed with patient certain preventive protocols, quality metrics, and best practice recommendations. A written personalized care plan for preventive services as well as general preventive health recommendations were provided to patient.     Vanetta Mulders, LPN   [id number]   Due to this being a virtual visit, the after visit summary with patients personalized plan was offered to patient via mail or my-chart.  Patient was mailed a copy of AVS  Nurse Notes: No concerns

## 2022-08-09 ENCOUNTER — Encounter: Payer: Self-pay | Admitting: Family Medicine

## 2022-08-09 ENCOUNTER — Ambulatory Visit (INDEPENDENT_AMBULATORY_CARE_PROVIDER_SITE_OTHER): Payer: Medicare Other | Admitting: Family Medicine

## 2022-08-09 VITALS — BP 124/62 | HR 65 | Ht 73.0 in | Wt 150.0 lb

## 2022-08-09 DIAGNOSIS — I10 Essential (primary) hypertension: Secondary | ICD-10-CM

## 2022-08-09 DIAGNOSIS — I2510 Atherosclerotic heart disease of native coronary artery without angina pectoris: Secondary | ICD-10-CM

## 2022-08-09 DIAGNOSIS — E7800 Pure hypercholesterolemia, unspecified: Secondary | ICD-10-CM | POA: Diagnosis not present

## 2022-08-09 NOTE — Progress Notes (Signed)
Subjective:    Patient ID: Albert Garner, male    DOB: 20-Jul-1941, 82 y.o.   MRN: [id number]  HPI  Patient is here today for follow-up of hypertension and hyperlipidemia.  He has carotid artery disease but last [place] in 2021 was reassuring: Summary:  Right Carotid: Velocities in the right ICA are consistent with a 1-39%  stenosis. The ECA appears >50% stenosed.  Left Carotid: Velocities in the left ICA are consistent with a 1-39%  stenosis. Vertebrals: Bilateral vertebral arteries demonstrate antegrade flow.  Appears to be due for flu, shingles and RSV shot along with covid booster.  Due to age does not require colonoscopy or PSA. Immunization History  Administered Date(s) Administered   PFIZER(Purple Top)SARS-COV-2 Vaccination 03/09/2020, 03/30/2020   Pneumococcal Conjugate-13 10/27/2015   Tdap 05/27/2020    Past Medical History:  Diagnosis Date   Allergy    occasional seasonal allergies   Carotid artery occlusion    Hypercholesteremia    Hyperlipidemia    Hypertension    Past Surgical History:  Procedure Laterality Date   COLONOSCOPY     2003 w/santagade-no report in epic   COLONOSCOPY  01/2015   Dr. Ardis Hughs: four polyps removed, 3 tubular adenomas. next tcs 3 years   COLONOSCOPY N/A 01/31/2018   diverticulosis, internal hemorrhoids.    ESOPHAGOGASTRODUODENOSCOPY N/A 03/09/2017   Dr. Gala Romney: Distal esophagus quite macerated, Schatzki ring not dilated, retained food in the hernia sac.   ESOPHAGOGASTRODUODENOSCOPY N/A 04/11/2017   Dr. 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   FOREIGN BODY REMOVAL N/A 03/09/2017   Procedure: FOREIGN BODY REMOVAL;  Surgeon: Daneil Dolin, MD;  [facility];  Service: Endoscopy;   Laterality: N/A;   INGUINAL HERNIA REPAIR Right 1986   MALONEY DILATION N/A 04/11/2017   Procedure: MALONEY DILATION;  Surgeon: Daneil Dolin, MD;  [facility];  Service: Endoscopy;  Laterality: N/A;   MALONEY DILATION N/A 11/13/2019   Procedure: [name] DILATION;  Surgeon: Daneil Dolin, MD;  [facility];  Service: Endoscopy;  Laterality: N/A;   MOUTH SURGERY     with sedation-bottom teeth pulled   Current Outpatient Medications on File Prior to Visit  Medication Sig Dispense Refill   aspirin 81 MG tablet Take 81 mg by mouth daily.     atorvastatin (LIPITOR) 40 MG tablet Take 1 tablet (40 mg total) by mouth daily. 90 tablet 0   lisinopril (ZESTRIL) 10 MG tablet Take 1 tablet (10 mg total) by mouth daily. 90 tablet 3   Nutritional Supplements (ENSURE ORIGINAL PO) Take 237 mLs by mouth 3 (three) times daily as needed (nutritional support). Drinks 1-3 daily     pantoprazole (PROTONIX) 40 MG tablet TAKE 1 TABLET BY MOUTH ONCE DAILY 30 MINUTES BEFORE BREAKFAST 90 tablet 0   traZODone (DESYREL) 50 MG tablet TAKE 2 TABLETS BY MOUTH EVERY NIGHT AT BEDTIME 180 tablet 1   triamcinolone cream (KENALOG) 0.1 % Apply 1 application topically 2 (two) times daily. 30 g 1   No current facility-administered medications on file prior to visit.   No Known Allergies Social History   Socioeconomic History   Marital status: Married    Spouse name: Not on file   Number  of children: Not on file   Years of education: Not on file   Highest education level: Not on file  Occupational History   Not on file  Tobacco Use   Smoking status: Never   Smokeless tobacco: Current    Types: Chew  Vaping Use   Vaping Use: Never used  Substance and Sexual Activity   Alcohol use: No   Drug use: No   Sexual activity: Yes  Other Topics Concern   Not on file  Social History Narrative   Not on file   Social Determinants of Health   Financial Resource Strain: Low Risk  (06/21/2022)    Overall Financial Resource Strain (CARDIA)    Difficulty of Paying Living Expenses: Not hard at all  Food Insecurity: No Food Insecurity (06/21/2022)   Hunger Vital Sign    Worried About Running Out of Food in the Last Year: Never true    Ran Out of Food in the Last Year: Never true  Transportation Needs: No Transportation Needs (06/21/2022)   PRAPARE - Hydrologist (Medical): No    Lack of Transportation (Non-Medical): No  Physical Activity: Inactive (06/21/2022)   Exercise Vital Sign    Days of Exercise per Week: 0 days    Minutes of Exercise per Session: 0 min  Stress: No Stress Concern Present (06/21/2022)   [place]    Feeling of Stress : Not at all  Social Connections: Moderately Integrated (06/21/2022)   Social Connection and Isolation Panel [NHANES]    Frequency of Communication with Friends and Family: More than three times a week    Frequency of Social Gatherings with Friends and Family: Three times a week    Attends Religious Services: More than 4 times per year    Active Member of Clubs or Organizations: No    Attends Archivist Meetings: Never    Marital Status: Married  Human resources officer Violence: Not At Risk (06/21/2022)   Humiliation, Afraid, Rape, and Kick questionnaire    Fear of Current or Ex-Partner: No    Emotionally Abused: No    Physically Abused: No    Sexually Abused: No   Family History  Problem Relation Age of Onset   Heart disease Mother    Throat cancer Brother    Colon cancer Neg Hx    Rectal cancer Neg Hx    Stomach cancer Neg Hx       Review of Systems  All other systems reviewed and are negative.      Objective:   Physical Exam Vitals reviewed.  Constitutional:      General: He is not in acute distress.    Appearance: He is well-developed. He is not diaphoretic.  HENT:     Head: Normocephalic and atraumatic.     Right Ear:  External ear normal.     Left Ear: External ear normal.     Nose: Nose normal.     Mouth/Throat:     Pharynx: No oropharyngeal exudate.  Eyes:     General: No scleral icterus.       Right eye: No discharge.        Left eye: No discharge.     Conjunctiva/sclera: Conjunctivae normal.     Pupils: Pupils are equal, round, and reactive to light.  Neck:     Thyroid: No thyromegaly.     Vascular: No JVD.     Trachea: No tracheal deviation.  Cardiovascular:     Rate and Rhythm: Normal rate and regular rhythm.     Heart sounds: Normal heart sounds. No murmur heard.    No friction rub. No gallop.  Pulmonary:     Effort: Pulmonary effort is normal. No respiratory distress.     Breath sounds: Normal breath sounds. No stridor. No wheezing or rales.  Chest:     Chest wall: No tenderness.  Abdominal:     General: Bowel sounds are normal. There is no distension.     Palpations: Abdomen is soft. There is no mass.     Tenderness: There is no abdominal tenderness. There is no guarding or rebound.  Musculoskeletal:        General: No tenderness. Normal range of motion.     Cervical back: Normal range of motion and neck supple.  Lymphadenopathy:     Cervical: No cervical adenopathy.  Skin:    General: Skin is warm.     Coloration: Skin is not pale.     Findings: No erythema or rash.  Neurological:     Mental Status: He is alert and oriented to person, place, and time.     Cranial Nerves: No cranial nerve deficit.     Motor: No abnormal muscle tone.     Coordination: Coordination normal.     Deep Tendon Reflexes: Reflexes are normal and symmetric.  Psychiatric:        Behavior: Behavior normal.        Thought Content: Thought content normal.        Judgment: Judgment normal.         Assessment & Plan:  Benign essential HTN - Plan: CBC with Differential/Platelet, COMPLETE METABOLIC PANEL WITH GFR, Lipid panel  Pure hypercholesterolemia - Plan: CBC with Differential/Platelet, COMPLETE  METABOLIC PANEL WITH GFR, Lipid panel  ASCVD (arteriosclerotic cardiovascular disease) - Plan: CBC with Differential/Platelet, COMPLETE METABOLIC PANEL WITH GFR, Lipid panel Blood pressure is excellent today.  He is drinking 2 ensures every day and eating well.  He denies any melena or hematochezia or abdominal pain or chest pain or shortness of breath.  Check CBC CMP and lipid panel.  Recommended flu shot, COVID shot, shingles vaccine.  Patient declined these today

## 2022-08-10 LAB — CBC WITH DIFFERENTIAL/PLATELET
Absolute Monocytes: 315 cells/uL (ref 200–950)
Basophils Absolute: 8 cells/uL (ref 0–200)
Basophils Relative: 0.2 %
Eosinophils Absolute: 202 cells/uL (ref 15–500)
Eosinophils Relative: 4.8 %
HCT: 34.6 % — ABNORMAL LOW (ref 38.5–50.0)
Hemoglobin: 11.8 g/dL — ABNORMAL LOW (ref 13.2–17.1)
Lymphs Abs: 1042 cells/uL (ref 850–3900)
MCH: 32.4 pg (ref 27.0–33.0)
MCHC: 34.1 g/dL (ref 32.0–36.0)
MCV: 95.1 fL (ref 80.0–100.0)
MPV: 10.3 fL (ref 7.5–12.5)
Monocytes Relative: 7.5 %
Neutro Abs: 2633 cells/uL (ref 1500–7800)
Neutrophils Relative %: 62.7 %
Platelets: 152 10*3/uL (ref 140–400)
RBC: 3.64 10*6/uL — ABNORMAL LOW (ref 4.20–5.80)
RDW: 11.9 % (ref 11.0–15.0)
Total Lymphocyte: 24.8 %
WBC: 4.2 10*3/uL (ref 3.8–10.8)

## 2022-08-10 LAB — COMPLETE METABOLIC PANEL WITH GFR
AG Ratio: 1.5 (calc) (ref 1.0–2.5)
ALT: 13 U/L (ref 9–46)
AST: 21 U/L (ref 10–35)
Albumin: 4 g/dL (ref 3.6–5.1)
Alkaline phosphatase (APISO): 85 U/L (ref 35–144)
BUN/Creatinine Ratio: 16 (calc) (ref 6–22)
BUN: 21 mg/dL (ref 7–25)
CO2: 29 mmol/L (ref 20–32)
Calcium: 9.2 mg/dL (ref 8.6–10.3)
Chloride: 107 mmol/L (ref 98–110)
Creat: 1.34 mg/dL — ABNORMAL HIGH (ref 0.70–1.22)
Globulin: 2.6 g/dL (calc) (ref 1.9–3.7)
Glucose, Bld: 89 mg/dL (ref 65–99)
Potassium: 5 mmol/L (ref 3.5–5.3)
Sodium: 143 mmol/L (ref 135–146)
Total Bilirubin: 0.6 mg/dL (ref 0.2–1.2)
Total Protein: 6.6 g/dL (ref 6.1–8.1)
eGFR: 53 mL/min/{1.73_m2} — ABNORMAL LOW (ref >=60)

## 2022-08-10 LAB — LIPID PANEL
Cholesterol: 126 mg/dL (ref <200)
HDL: 46 mg/dL (ref >=40)
LDL Cholesterol (Calc): 65 mg/dL (calc)
Non-HDL Cholesterol (Calc): 80 mg/dL (calc) (ref <130)
Total CHOL/HDL Ratio: 2.7 (calc) (ref <5.0)
Triglycerides: 72 mg/dL (ref <150)

## 2022-08-12 ENCOUNTER — Encounter: Payer: Self-pay | Admitting: Family Medicine

## 2022-08-18 ENCOUNTER — Other Ambulatory Visit: Payer: Self-pay | Admitting: Family Medicine

## 2022-08-20 NOTE — Telephone Encounter (Signed)
OV- 08/09/22 Requested Prescriptions  Pending Prescriptions Disp Refills   traZODone (DESYREL) 50 MG tablet [Pharmacy Med Name: TRAZODONE 50 MG TABLET] 60 tablet 0    Sig: TAKE 2 TABLETS AT BEDTIME DAILY.     Psychiatry: Antidepressants - Serotonin Modulator Failed - 08/18/2022 11:47 AM      Failed - Valid encounter within last 6 months    Recent Outpatient Visits           1 year ago Routine general medical examination at a health care facility   [place], Cammie Mcgee, MD   2 years ago Routine general medical examination at a health care facility   [place], Cammie Mcgee, MD   3 years ago Routine general medical examination at a health care facility   [place], Cammie Mcgee, MD   4 years ago Pure hypercholesterolemia   [place], Cammie Mcgee, MD   5 years ago Pure hypercholesterolemia   [place], Cammie Mcgee, MD       Future Appointments             In 5 months Pickard, Cammie Mcgee, MD [place], [facility]

## 2022-08-21 ENCOUNTER — Other Ambulatory Visit: Payer: Self-pay | Admitting: Family Medicine

## 2022-08-21 DIAGNOSIS — K224 Dyskinesia of esophagus: Secondary | ICD-10-CM

## 2022-08-21 DIAGNOSIS — R1319 Other dysphagia: Secondary | ICD-10-CM

## 2022-08-21 DIAGNOSIS — E7800 Pure hypercholesterolemia, unspecified: Secondary | ICD-10-CM

## 2022-10-11 DIAGNOSIS — Z961 Presence of intraocular lens: Secondary | ICD-10-CM | POA: Diagnosis not present

## 2022-10-11 DIAGNOSIS — H5203 Hypermetropia, bilateral: Secondary | ICD-10-CM | POA: Diagnosis not present

## 2022-10-11 DIAGNOSIS — H52203 Unspecified astigmatism, bilateral: Secondary | ICD-10-CM | POA: Diagnosis not present

## 2022-10-11 DIAGNOSIS — H524 Presbyopia: Secondary | ICD-10-CM | POA: Diagnosis not present

## 2022-10-16 ENCOUNTER — Other Ambulatory Visit: Payer: Self-pay | Admitting: Family Medicine

## 2022-11-12 ENCOUNTER — Other Ambulatory Visit: Payer: Self-pay | Admitting: Family Medicine

## 2022-11-13 ENCOUNTER — Ambulatory Visit: Payer: Medicare Other

## 2022-12-04 ENCOUNTER — Other Ambulatory Visit: Payer: Self-pay | Admitting: *Deleted

## 2022-12-04 ENCOUNTER — Ambulatory Visit: Payer: Medicare Other | Admitting: Physician Assistant

## 2022-12-04 ENCOUNTER — Ambulatory Visit (HOSPITAL_COMMUNITY)
Admission: RE | Admit: 2022-12-04 | Discharge: 2022-12-04 | Disposition: A | Discharge status: Home / Self Care | Payer: Medicare Other | Source: Ambulatory Visit | Attending: Vascular Surgery | Admitting: Vascular Surgery

## 2022-12-04 VITALS — BP 133/64 | HR 65 | Temp 98.2°F | Resp 20 | Ht 73.0 in | Wt 151.4 lb

## 2022-12-04 DIAGNOSIS — I6523 Occlusion and stenosis of bilateral carotid arteries: Secondary | ICD-10-CM

## 2022-12-04 DIAGNOSIS — R0989 Other specified symptoms and signs involving the circulatory and respiratory systems: Secondary | ICD-10-CM

## 2022-12-04 NOTE — Progress Notes (Signed)
HISTORY AND PHYSICAL     CC:  follow up. Requesting Provider:  Donita Brooks, MD  HPI: This is a 82 y.o. male here for follow up for carotid artery stenosis. His carotid stenosis has been 1-39% bilaterally. He has no history of prior surgery. He has no previous history of TIA or Stroke.  He initially was found to have 60-79% right ICA stenosis many years ago and it has been stable in the 1-39% range for many years.    Pt was last seen 06/24/2020 and at that time he was doing well without any neurological sx.   Pt returns today for follow up.    Pt denies any amaurosis fugax, speech difficulties, weakness, numbness, paralysis or clumsiness or facial droop.  He states he does quite a bit of walking but gets tired.  He does the same amount of walking it just takes him a little longer.   He states that he has been married for 63 years.  He was on [employer] with Marletta Lor.   The pt is on a statin for cholesterol management.  The pt is on a daily aspirin.   Other AC:  none The pt is on ACEI for hypertension.   The pt is not on medication for diabetes Tobacco hx:  never   Past Medical History:  Diagnosis Date   Allergy    occasional seasonal allergies   Carotid artery occlusion    Hypercholesteremia    Hyperlipidemia    Hypertension     Past Surgical History:  Procedure Laterality Date   COLONOSCOPY     2003 w/santagade-no report in epic   COLONOSCOPY  01/2015   Dr. Christella Hartigan: four polyps removed, 3 tubular adenomas. next tcs 3 years   COLONOSCOPY N/A 01/31/2018   diverticulosis, internal hemorrhoids.    ESOPHAGOGASTRODUODENOSCOPY N/A 03/09/2017   Dr. Jena Gauss: Distal esophagus quite macerated, Schatzki ring not dilated, retained food in the hernia sac.   ESOPHAGOGASTRODUODENOSCOPY N/A 04/11/2017   Dr. 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   FOREIGN BODY REMOVAL N/A 03/09/2017   Procedure: FOREIGN BODY REMOVAL;  Surgeon: Corbin Ade, MD;  [facility];  Service: Endoscopy;  Laterality: N/A;   INGUINAL HERNIA REPAIR Right 1986   MALONEY DILATION N/A 04/11/2017   Procedure: MALONEY DILATION;  Surgeon: Corbin Ade, MD;  [facility];  Service: Endoscopy;  Laterality: N/A;   MALONEY DILATION N/A 11/13/2019   Procedure: Elease Hashimoto DILATION;  Surgeon: Corbin Ade, MD;  [facility];  Service: Endoscopy;  Laterality: N/A;   MOUTH SURGERY     with sedation-bottom teeth pulled    No Known Allergies  Current Outpatient Medications  Medication Sig Dispense Refill   aspirin 81 MG tablet Take 81 mg by mouth daily.     atorvastatin (LIPITOR) 40 MG tablet TAKE ONE TABLET BY MOUTH ONCE DAILY. 90 tablet 0   lisinopril (ZESTRIL) 10 MG tablet Take 1 tablet (10 mg total) by mouth daily. 90 tablet 3   Nutritional Supplements (ENSURE ORIGINAL PO) Take 237 mLs by mouth 3 (three) times daily as needed (nutritional support). Drinks 1-3 daily     pantoprazole (PROTONIX) 40 MG tablet TAKE 1  TABLET BY MOUTH ONCE DAILY 30 MINUTES BEFORE BREAKFAST 90 tablet 0   traZODone (DESYREL) 50 MG tablet TAKE 2 TABLETS AT BEDTIME DAILY. 60 tablet 0   triamcinolone cream (KENALOG) 0.1 % Apply 1 application topically 2 (two) times daily. 30 g 1   No current facility-administered medications for this visit.    Family History  Problem Relation Age of Onset   Heart disease Mother    Throat cancer Brother    Colon cancer Neg Hx    Rectal cancer Neg Hx    Stomach cancer Neg Hx     Social History   Socioeconomic History   Marital status: Married    Spouse name: Not on file   Number of children: Not on file   Years of education: Not on file   Highest education level: Not on file   Occupational History   Not on file  Tobacco Use   Smoking status: Never   Smokeless tobacco: Current    Types: Chew  Vaping Use   Vaping Use: Never used  Substance and Sexual Activity   Alcohol use: No   Drug use: No   Sexual activity: Yes  Other Topics Concern   Not on file  Social History Narrative   Not on file   Social Determinants of Health   Financial Resource Strain: Low Risk  (06/21/2022)   Overall Financial Resource Strain (CARDIA)    Difficulty of Paying Living Expenses: Not hard at all  Food Insecurity: No Food Insecurity (06/21/2022)   Hunger Vital Sign    Worried About Running Out of Food in the Last Year: Never true    Ran Out of Food in the Last Year: Never true  Transportation Needs: No Transportation Needs (06/21/2022)   PRAPARE - Administrator, Civil Service (Medical): No    Lack of Transportation (Non-Medical): No  Physical Activity: Inactive (06/21/2022)   Exercise Vital Sign    Days of Exercise per Week: 0 days    Minutes of Exercise per Session: 0 min  Stress: No Stress Concern Present (06/21/2022)   [employer] of Occupational Health - Occupational Stress Questionnaire    Feeling of Stress : Not at all  Social Connections: Moderately Integrated (06/21/2022)   Social Connection and Isolation Panel [NHANES]    Frequency of Communication with Friends and Family: More than three times a week    Frequency of Social Gatherings with Friends and Family: Three times a week    Attends Religious Services: More than 4 times per year    Active Member of Clubs or Organizations: No    Attends Banker Meetings: Never    Marital Status: Married  Catering manager Violence: Not At Risk (06/21/2022)   Humiliation, Afraid, Rape, and Kick questionnaire    Fear of Current or Ex-Partner: No    Emotionally Abused: No    Physically Abused: No    Sexually Abused: No     REVIEW OF SYSTEMS:   [X]  denotes positive finding, [ ]  denotes  negative finding Cardiac  Comments:  Chest pain or chest pressure:    Shortness of breath upon exertion:    Short of breath when lying flat:    Irregular heart rhythm:        Vascular    Pain in calf, thigh, or hip brought on by ambulation:    Pain in feet at night that wakes you up from your sleep:     Blood clot in your veins:  Leg swelling:         Pulmonary    Oxygen at home:    Productive cough:     Wheezing:         Neurologic    Sudden weakness in arms or legs:     Sudden numbness in arms or legs:     Sudden onset of difficulty speaking or slurred speech:    Temporary loss of vision in one eye:     Problems with dizziness:         Gastrointestinal    Blood in stool:     Vomited blood:         Genitourinary    Burning when urinating:     Blood in urine:        Psychiatric    Major depression:         Hematologic    Bleeding problems:    Problems with blood clotting too easily:        Skin    Rashes or ulcers:        Constitutional    Fever or chills:      PHYSICAL EXAMINATION:  Today's Vitals   12/04/22 1005 12/04/22 1007  BP: 129/68 133/64  Pulse: 65   Resp: 20   Temp: 98.2 F (36.8 C)   TempSrc: Temporal   SpO2: 98%   Weight: 151 lb 6.4 oz (68.7 kg)   Height: 6\' 1"  (1.854 m)    Body mass index is 19.97 kg/m.   General:  WDWN in NAD; vital signs documented above Gait: Not observed HENT: WNL, normocephalic Pulmonary: normal non-labored breathing Cardiac: regular HR, without carotid bruits Abdomen: soft, NT; aortic pulse is not palpable Skin: without rashes Vascular Exam/Pulses:  Right Left  Radial 2+ (normal) 2+ (normal)   Extremities:without open wounds Musculoskeletal: no muscle wasting or atrophy  Neurologic: A&O X 3; moving all extremities equally; speech is fluent/normal Psychiatric:  The pt has Normal affect.   Non-Invasive Vascular Imaging:   Carotid Duplex on 12/04/2022 Right:  1-39% ICA stenosis Left:  1-39% ICA  stenosis   Previous Carotid duplex on 06/24/2020: Right: 1-39% ICA stenosis Left:   1-39% ICA stenosis    ASSESSMENT/PLAN:: 82 y.o. male here for follow up carotid artery stenosis  -duplex today reveals 1-39% bilateral ICA stenosis and he remains asymptomatic -discussed s/s of stroke with pt and he understands should he develop any of these sx, he will go to the nearest ER or call 911. -pt will f/u in 2 years with carotid duplex -pt will call sooner should he have any issues. -continue statin/asa    Doreatha Massed, [facility] [phone]  Clinic MD:  Chestine Spore

## 2022-12-11 ENCOUNTER — Other Ambulatory Visit: Payer: Self-pay | Admitting: Family Medicine

## 2022-12-11 DIAGNOSIS — R1319 Other dysphagia: Secondary | ICD-10-CM

## 2022-12-11 DIAGNOSIS — K224 Dyskinesia of esophagus: Secondary | ICD-10-CM

## 2022-12-11 NOTE — Telephone Encounter (Signed)
Requested Prescriptions  Pending Prescriptions Disp Refills   traZODone (DESYREL) 50 MG tablet [Pharmacy Med Name: TRAZODONE 50 MG TABLET] 180 tablet 1    Sig: TAKE 2 TABLETS AT BEDTIME DAILY.     Psychiatry: Antidepressants - Serotonin Modulator Failed - 12/11/2022  9:18 AM      Failed - Valid encounter within last 6 months    Recent Outpatient Visits           1 year ago Routine general medical examination at a health care facility   [facility] [place], Priscille Heidelberg, MD   2 years ago Routine general medical examination at a health care facility   [facility] [place], Priscille Heidelberg, MD   3 years ago Routine general medical examination at a health care facility   [facility] Pickard, Priscille Heidelberg, MD   4 years ago Pure hypercholesterolemia   [facility] Tanya Nones, Priscille Heidelberg, MD   5 years ago Pure hypercholesterolemia   [facility] Pickard, Priscille Heidelberg, MD       Future Appointments             In 1 month Pickard, Priscille Heidelberg, MD [facility], [facility]             pantoprazole (PROTONIX) 40 MG tablet [Pharmacy Med Name: PANTOPRAZOLE DR 40 MG TABLET] 90 tablet 1    Sig: TAKE 1 TABLET BY MOUTH ONCE DAILY 30 MINUTES BEFORE BREAKFAST     Gastroenterology: Proton Pump Inhibitors Failed - 12/11/2022  9:18 AM      Failed - Valid encounter within last 12 months    Recent Outpatient Visits           1 year ago Routine general medical examination at a health care facility   [facility] Pickard, Priscille Heidelberg, MD   2 years ago Routine general medical examination at a health care facility   [facility] Pickard, Priscille Heidelberg, MD   3 years ago Routine general medical examination at a health care facility   [facility] Pickard, Priscille Heidelberg, MD   4 years ago Pure hypercholesterolemia   [facility] Tanya Nones, Priscille Heidelberg, MD   5 years ago Pure  hypercholesterolemia   [facility] Pickard, Priscille Heidelberg, MD       Future Appointments             In 1 month Pickard, Priscille Heidelberg, MD [facility], [facility]

## 2022-12-13 ENCOUNTER — Other Ambulatory Visit: Payer: Self-pay | Admitting: Family Medicine

## 2022-12-13 DIAGNOSIS — E7800 Pure hypercholesterolemia, unspecified: Secondary | ICD-10-CM

## 2022-12-13 NOTE — Telephone Encounter (Signed)
OV 08/09/22 Requested Prescriptions  Pending Prescriptions Disp Refills   atorvastatin (LIPITOR) 40 MG tablet [Pharmacy Med Name: ATORVASTATIN 40 MG TABLET] 90 tablet 0    Sig: TAKE ONE TABLET BY MOUTH ONCE DAILY.     Cardiovascular:  Antilipid - Statins Failed - 12/13/2022  1:01 PM      Failed - Valid encounter within last 12 months    Recent Outpatient Visits           1 year ago Routine general medical examination at a health care facility   [facility] Medicine Pickard, Priscille Heidelberg, MD   2 years ago Routine general medical examination at a health care facility   [facility] Pickard, Priscille Heidelberg, MD   3 years ago Routine general medical examination at a health care facility   [facility] Pickard, Priscille Heidelberg, MD   4 years ago Pure hypercholesterolemia   [facility] Tanya Nones, Priscille Heidelberg, MD   5 years ago Pure hypercholesterolemia   [facility] Pickard, Priscille Heidelberg, MD       Future Appointments             In 1 month Pickard, Priscille Heidelberg, MD [facility], [facility]            Failed - Lipid Panel in normal range within the last 12 months    Cholesterol  Date Value Ref Range Status  08/09/2022 126 <200 mg/dL Final   LDL Cholesterol (Calc)  Date Value Ref Range Status  08/09/2022 65 mg/dL (calc) Final    Comment:    Reference range: <100 . Desirable range <100 mg/dL for primary prevention;   <70 mg/dL for patients with CHD or diabetic patients  with > or = 2 CHD risk factors. Marland Kitchen LDL-C is now calculated using the Martin-Hopkins  calculation, which is a validated novel method providing  better accuracy than the Friedewald equation in the  estimation of LDL-C.  Horald Pollen et al. Lenox Ahr. 1610;960(45): 2061-2068  (http://education.[facility].com/faq/FAQ164)    HDL  Date Value Ref Range Status  08/09/2022 46 > OR = 40 mg/dL Final   Triglycerides  Date Value Ref Range Status   08/09/2022 72 <150 mg/dL Final         Passed - Patient is not pregnant

## 2023-02-07 ENCOUNTER — Ambulatory Visit (INDEPENDENT_AMBULATORY_CARE_PROVIDER_SITE_OTHER): Payer: Medicare Other | Admitting: Family Medicine

## 2023-02-07 ENCOUNTER — Encounter: Payer: Self-pay | Admitting: Family Medicine

## 2023-02-07 VITALS — BP 112/62 | HR 65 | Temp 97.5°F | Ht 73.0 in | Wt 146.2 lb

## 2023-02-07 DIAGNOSIS — I10 Essential (primary) hypertension: Secondary | ICD-10-CM | POA: Diagnosis not present

## 2023-02-07 DIAGNOSIS — E7800 Pure hypercholesterolemia, unspecified: Secondary | ICD-10-CM | POA: Diagnosis not present

## 2023-02-07 NOTE — Progress Notes (Signed)
Subjective:    Patient ID: Albert Garner, male    DOB: 1941/03/02, 82 y.o.   MRN: [id number]  HPI  Patient is here today for follow-up of hypertension and hyperlipidemia.  He has carotid artery disease but this was recently checked by his vascular surgeon in May.  The patient was found to have between 1 and 39% internal carotid artery stenosis bilaterally.  He is still taking his aspirin and statin.  His blood pressure today is quite low.  He denies any syncope or near syncope but he does report fatigue.  Past Medical History:  Diagnosis Date   Allergy    occasional seasonal allergies   Carotid artery occlusion    Hypercholesteremia    Hyperlipidemia    Hypertension    Past Surgical History:  Procedure Laterality Date   COLONOSCOPY     2003 w/santagade-no report in epic   COLONOSCOPY  01/2015   Dr. Christella Hartigan: four polyps removed, 3 tubular adenomas. next tcs 3 years   COLONOSCOPY N/A 01/31/2018   diverticulosis, internal hemorrhoids.    ESOPHAGOGASTRODUODENOSCOPY N/A 03/09/2017   Dr. Jena Gauss: Distal esophagus quite macerated, Schatzki ring not dilated, retained food in the hernia sac.   ESOPHAGOGASTRODUODENOSCOPY N/A 04/11/2017   Dr. 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   FOREIGN BODY REMOVAL N/A 03/09/2017   Procedure: FOREIGN BODY REMOVAL;  Surgeon: Corbin Ade, MD;  [facility];  Service: Endoscopy;  Laterality: N/A;   INGUINAL HERNIA REPAIR Right 1986   MALONEY DILATION N/A 04/11/2017   Procedure: MALONEY DILATION;  Surgeon: Corbin Ade, MD;  [facility];  Service: Endoscopy;  Laterality: N/A;   MALONEY DILATION N/A 11/13/2019   Procedure: Elease Hashimoto DILATION;  Surgeon: Corbin Ade, MD;   [facility];  Service: Endoscopy;  Laterality: N/A;   MOUTH SURGERY     with sedation-bottom teeth pulled   Current Outpatient Medications on File Prior to Visit  Medication Sig Dispense Refill   aspirin 81 MG tablet Take 81 mg by mouth daily.     atorvastatin (LIPITOR) 40 MG tablet TAKE ONE TABLET BY MOUTH ONCE DAILY. 90 tablet 0   lisinopril (ZESTRIL) 10 MG tablet Take 1 tablet (10 mg total) by mouth daily. 90 tablet 3   Nutritional Supplements (ENSURE ORIGINAL PO) Take 237 mLs by mouth 3 (three) times daily as needed (nutritional support). Drinks 1-3 daily     pantoprazole (PROTONIX) 40 MG tablet TAKE 1 TABLET BY MOUTH ONCE DAILY 30 MINUTES BEFORE BREAKFAST 90 tablet 1   traZODone (DESYREL) 50 MG tablet TAKE 2 TABLETS AT BEDTIME DAILY. 180 tablet 1   triamcinolone cream (KENALOG) 0.1 % Apply 1 application topically 2 (two) times daily. 30 g 1   No current facility-administered medications on file prior to visit.   No Known Allergies Social History   Socioeconomic History   Marital status: Married    Spouse name: Not on file   Number of children: Not on file   Years of education: Not on file   Highest education level: Not on file  Occupational History   Not on file  Tobacco Use   Smoking status: Never    Passive exposure: Never   Smokeless tobacco:  Current    Types: Chew  Vaping Use   Vaping status: Never Used  Substance and Sexual Activity   Alcohol use: No   Drug use: No   Sexual activity: Yes  Other Topics Concern   Not on file  Social History Narrative   Not on file   Social Determinants of Health   Financial Resource Strain: Low Risk  (06/21/2022)   Overall Financial Resource Strain (CARDIA)    Difficulty of Paying Living Expenses: Not hard at all  Food Insecurity: No Food Insecurity (06/21/2022)   Hunger Vital Sign    Worried About Running Out of Food in the Last Year: Never true    Ran Out of Food in the Last Year: Never true  Transportation  Needs: No Transportation Needs (06/21/2022)   PRAPARE - Administrator, Civil Service (Medical): No    Lack of Transportation (Non-Medical): No  Physical Activity: Inactive (06/21/2022)   Exercise Vital Sign    Days of Exercise per Week: 0 days    Minutes of Exercise per Session: 0 min  Stress: No Stress Concern Present (06/21/2022)   [employer] of Occupational Health - Occupational Stress Questionnaire    Feeling of Stress : Not at all  Social Connections: Moderately Integrated (06/21/2022)   Social Connection and Isolation Panel [NHANES]    Frequency of Communication with Friends and Family: More than three times a week    Frequency of Social Gatherings with Friends and Family: Three times a week    Attends Religious Services: More than 4 times per year    Active Member of Clubs or Organizations: No    Attends Banker Meetings: Never    Marital Status: Married  Catering manager Violence: Not At Risk (06/21/2022)   Humiliation, Afraid, Rape, and Kick questionnaire    Fear of Current or Ex-Partner: No    Emotionally Abused: No    Physically Abused: No    Sexually Abused: No   Family History  Problem Relation Age of Onset   Heart disease Mother    Throat cancer Brother    Colon cancer Neg Hx    Rectal cancer Neg Hx    Stomach cancer Neg Hx       Review of Systems  All other systems reviewed and are negative.      Objective:   Physical Exam Vitals reviewed.  Constitutional:      General: He is not in acute distress.    Appearance: He is well-developed. He is not diaphoretic.  HENT:     Head: Normocephalic and atraumatic.     Right Ear: External ear normal.     Left Ear: External ear normal.     Nose: Nose normal.     Mouth/Throat:     Pharynx: No oropharyngeal exudate.  Eyes:     General: No scleral icterus.       Right eye: No discharge.        Left eye: No discharge.     Conjunctiva/sclera: Conjunctivae normal.     Pupils:  Pupils are equal, round, and reactive to light.  Neck:     Thyroid: No thyromegaly.     Vascular: No JVD.     Trachea: No tracheal deviation.  Cardiovascular:     Rate and Rhythm: Normal rate and regular rhythm.     Heart sounds: Normal heart sounds. No murmur heard.    No friction rub. No gallop.  Pulmonary:     Effort:  Pulmonary effort is normal. No respiratory distress.     Breath sounds: Normal breath sounds. No stridor. No wheezing or rales.  Chest:     Chest wall: No tenderness.  Abdominal:     General: Bowel sounds are normal. There is no distension.     Palpations: Abdomen is soft. There is no mass.     Tenderness: There is no abdominal tenderness. There is no guarding or rebound.  Musculoskeletal:        General: No tenderness. Normal range of motion.     Cervical back: Normal range of motion and neck supple.  Lymphadenopathy:     Cervical: No cervical adenopathy.  Skin:    General: Skin is warm.     Coloration: Skin is not pale.     Findings: No erythema or rash.  Neurological:     Mental Status: He is alert and oriented to person, place, and time.     Cranial Nerves: No cranial nerve deficit.     Motor: No abnormal muscle tone.     Coordination: Coordination normal.     Deep Tendon Reflexes: Reflexes are normal and symmetric.  Psychiatric:        Behavior: Behavior normal.        Thought Content: Thought content normal.        Judgment: Judgment normal.         Assessment & Plan:  Benign essential HTN - Plan: CBC with Differential/Platelet, COMPLETE METABOLIC PANEL WITH GFR, Lipid panel  Pure hypercholesterolemia - Plan: CBC with Differential/Platelet, COMPLETE METABOLIC PANEL WITH GFR, Lipid panel Temporarily discontinue lisinopril.  Recheck blood pressure in 2 weeks.  Try to drink more water.  The patient has relative hypotension given his age.  As long as we can maintain blood pressure less than 140/90 without medication he may be able to hold the lisinopril.   I want his LDL cholesterol to be below 55

## 2023-02-08 LAB — CBC WITH DIFFERENTIAL/PLATELET
Absolute Monocytes: 326 cells/uL (ref 200–950)
Basophils Absolute: 22 cells/uL (ref 0–200)
Basophils Relative: 0.5 %
Eosinophils Absolute: 40 cells/uL (ref 15–500)
Eosinophils Relative: 0.9 %
HCT: 36.3 % — ABNORMAL LOW (ref 38.5–50.0)
Hemoglobin: 12.1 g/dL — ABNORMAL LOW (ref 13.2–17.1)
Lymphs Abs: 1043 cells/uL (ref 850–3900)
MCH: 31.9 pg (ref 27.0–33.0)
MCHC: 33.3 g/dL (ref 32.0–36.0)
MCV: 95.8 fL (ref 80.0–100.0)
MPV: 10.8 fL (ref 7.5–12.5)
Monocytes Relative: 7.4 %
Neutro Abs: 2970 cells/uL (ref 1500–7800)
Neutrophils Relative %: 67.5 %
Platelets: 126 10*3/uL — ABNORMAL LOW (ref 140–400)
RBC: 3.79 10*6/uL — ABNORMAL LOW (ref 4.20–5.80)
RDW: 12.3 % (ref 11.0–15.0)
Total Lymphocyte: 23.7 %
WBC: 4.4 10*3/uL (ref 3.8–10.8)

## 2023-02-08 LAB — LIPID PANEL
Cholesterol: 130 mg/dL (ref <200)
HDL: 46 mg/dL (ref >=40)
LDL Cholesterol (Calc): 68 mg/dL (calc)
Non-HDL Cholesterol (Calc): 84 mg/dL (calc) (ref <130)
Total CHOL/HDL Ratio: 2.8 (calc) (ref <5.0)
Triglycerides: 82 mg/dL (ref <150)

## 2023-02-08 LAB — COMPLETE METABOLIC PANEL WITH GFR
AG Ratio: 1.7 (calc) (ref 1.0–2.5)
ALT: 11 U/L (ref 9–46)
AST: 17 U/L (ref 10–35)
Albumin: 4.2 g/dL (ref 3.6–5.1)
Alkaline phosphatase (APISO): 78 U/L (ref 35–144)
BUN/Creatinine Ratio: 17 (calc) (ref 6–22)
BUN: 22 mg/dL (ref 7–25)
CO2: 31 mmol/L (ref 20–32)
Calcium: 9.5 mg/dL (ref 8.6–10.3)
Chloride: 104 mmol/L (ref 98–110)
Creat: 1.29 mg/dL — ABNORMAL HIGH (ref 0.70–1.22)
Globulin: 2.5 g/dL (calc) (ref 1.9–3.7)
Glucose, Bld: 92 mg/dL (ref 65–99)
Potassium: 4.4 mmol/L (ref 3.5–5.3)
Sodium: 142 mmol/L (ref 135–146)
Total Bilirubin: 0.5 mg/dL (ref 0.2–1.2)
Total Protein: 6.7 g/dL (ref 6.1–8.1)
eGFR: 55 mL/min/{1.73_m2} — ABNORMAL LOW (ref >=60)

## 2023-03-07 ENCOUNTER — Ambulatory Visit (INDEPENDENT_AMBULATORY_CARE_PROVIDER_SITE_OTHER): Payer: Medicare Other

## 2023-03-07 VITALS — BP 112/56 | HR 71 | Ht 73.0 in | Wt 146.0 lb

## 2023-03-07 DIAGNOSIS — I10 Essential (primary) hypertension: Secondary | ICD-10-CM

## 2023-03-08 NOTE — Progress Notes (Signed)
Pt here for BP check. Pt states he is doing well. VSS. Advised to call if any issues. Pt verbalized understanding of all. Mjp,lpn

## 2023-03-11 ENCOUNTER — Other Ambulatory Visit: Payer: Self-pay | Admitting: Family Medicine

## 2023-03-11 DIAGNOSIS — R1319 Other dysphagia: Secondary | ICD-10-CM

## 2023-03-11 DIAGNOSIS — K224 Dyskinesia of esophagus: Secondary | ICD-10-CM

## 2023-03-12 NOTE — Telephone Encounter (Signed)
Requested Prescriptions  Pending Prescriptions Disp Refills   pantoprazole (PROTONIX) 40 MG tablet [Pharmacy Med Name: PANTOPRAZOLE DR 40 MG TABLET] 90 tablet 0    Sig: TAKE 1 TABLET BY MOUTH ONCE DAILY 30 MINUTES BEFORE BREAKFAST     Gastroenterology: Proton Pump Inhibitors Failed - 03/11/2023  8:47 AM      Failed - Valid encounter within last 12 months    Recent Outpatient Visits           1 year ago Routine general medical examination at a health care facility   [facility] Pickard, Priscille Heidelberg, MD   2 years ago Routine general medical examination at a health care facility   [facility] Pickard, Priscille Heidelberg, MD   3 years ago Routine general medical examination at a health care facility   [facility] Pickard, Priscille Heidelberg, MD   4 years ago Pure hypercholesterolemia   [facility] Tanya Nones, Priscille Heidelberg, MD   5 years ago Pure hypercholesterolemia   [facility] Pickard, Priscille Heidelberg, MD       Future Appointments             In 5 months Pickard, Priscille Heidelberg, MD [facility], [facility]

## 2023-03-18 ENCOUNTER — Other Ambulatory Visit: Payer: Self-pay | Admitting: Family Medicine

## 2023-03-18 DIAGNOSIS — E7800 Pure hypercholesterolemia, unspecified: Secondary | ICD-10-CM

## 2023-03-19 NOTE — Telephone Encounter (Signed)
Requested Prescriptions  Pending Prescriptions Disp Refills   atorvastatin (LIPITOR) 40 MG tablet [Pharmacy Med Name: ATORVASTATIN 40 MG TABLET] 90 tablet 3    Sig: TAKE ONE TABLET BY MOUTH ONCE DAILY.     Cardiovascular:  Antilipid - Statins Failed - 03/18/2023  8:27 AM      Failed - Valid encounter within last 12 months    Recent Outpatient Visits           1 year ago Routine general medical examination at a health care facility   [facility] Pickard, Priscille Heidelberg, MD   2 years ago Routine general medical examination at a health care facility   [facility] Pickard, Priscille Heidelberg, MD   3 years ago Routine general medical examination at a health care facility   [facility] Pickard, Priscille Heidelberg, MD   4 years ago Pure hypercholesterolemia   [facility] Tanya Nones, Priscille Heidelberg, MD   5 years ago Pure hypercholesterolemia   [facility] Pickard, Priscille Heidelberg, MD       Future Appointments             In 4 months Pickard, Priscille Heidelberg, MD [facility], [facility]            Failed - Lipid Panel in normal range within the last 12 months    Cholesterol  Date Value Ref Range Status  02/07/2023 130 <200 mg/dL Final   LDL Cholesterol (Calc)  Date Value Ref Range Status  02/07/2023 68 mg/dL (calc) Final    Comment:    Reference range: <100 . Desirable range <100 mg/dL for primary prevention;   <70 mg/dL for patients with CHD or diabetic patients  with > or = 2 CHD risk factors. Marland Kitchen LDL-C is now calculated using the Martin-Hopkins  calculation, which is a validated novel method providing  better accuracy than the Friedewald equation in the  estimation of LDL-C.  Horald Pollen et al. Lenox Ahr. 1610;960(45): 2061-2068  (http://education.[employer].com/faq/FAQ164)    HDL  Date Value Ref Range Status  02/07/2023 46 > OR = 40 mg/dL Final   Triglycerides  Date Value Ref Range Status  02/07/2023 82 <150  mg/dL Final         Passed - Patient is not pregnant

## 2023-06-03 ENCOUNTER — Other Ambulatory Visit: Payer: Self-pay | Admitting: Family Medicine

## 2023-06-04 NOTE — Telephone Encounter (Signed)
Requested Prescriptions  Pending Prescriptions Disp Refills   traZODone (DESYREL) 50 MG tablet [Pharmacy Med Name: trazodone 50 mg tablet] 180 tablet 0    Sig: TAKE 2 TABLETS AT BEDTIME DAILY.     Psychiatry: Antidepressants - Serotonin Modulator Failed - 06/03/2023  8:02 AM      Failed - Valid encounter within last 6 months    Recent Outpatient Visits           2 years ago Routine general medical examination at a health care facility   [facility] Pickard, Priscille Heidelberg, MD   3 years ago Routine general medical examination at a health care facility   [facility] Pickard, Priscille Heidelberg, MD   4 years ago Routine general medical examination at a health care facility   [facility] Pickard, Priscille Heidelberg, MD   5 years ago Pure hypercholesterolemia   [facility] Tanya Nones, Priscille Heidelberg, MD   6 years ago Pure hypercholesterolemia   [facility] Pickard, Priscille Heidelberg, MD       Future Appointments             In 2 months Pickard, Priscille Heidelberg, MD [facility], [facility]

## 2023-06-09 ENCOUNTER — Other Ambulatory Visit: Payer: Self-pay | Admitting: Family Medicine

## 2023-06-09 DIAGNOSIS — K224 Dyskinesia of esophagus: Secondary | ICD-10-CM

## 2023-06-09 DIAGNOSIS — R1319 Other dysphagia: Secondary | ICD-10-CM

## 2023-06-10 ENCOUNTER — Telehealth: Payer: Self-pay

## 2023-06-10 ENCOUNTER — Other Ambulatory Visit: Payer: Self-pay

## 2023-06-10 DIAGNOSIS — K224 Dyskinesia of esophagus: Secondary | ICD-10-CM

## 2023-06-10 DIAGNOSIS — R1319 Other dysphagia: Secondary | ICD-10-CM

## 2023-06-10 MED ORDER — PANTOPRAZOLE SODIUM 40 MG PO TBEC: DELAYED_RELEASE_TABLET | ORAL | 1 refills | Status: DC

## 2023-06-10 NOTE — Telephone Encounter (Signed)
  Sent in medication.   Copied from CRM [id number]. Topic: Clinical - Medication Refill >> Jun 10, 2023 11:33 AM Tiffany H wrote: Most Recent Primary Care Visit:  Provider: Venia Carbon K  Department: [facility]  Visit Type: NURSE VISIT  Date: 03/07/2023  Medication: Pantoprazole 40mg  1x   Has the patient contacted their pharmacy? Yes (Agent: If no, request that the patient contact the pharmacy for the refill. If patient does not wish to contact the pharmacy document the reason why and proceed with request.) (Agent: If yes, when and what did the pharmacy advise?) Sent them back to practice.   Is this the correct pharmacy for this prescription? Yes If no, delete pharmacy and type the correct one.  This is the patient's preferred pharmacy:  [facility] - [place], [place] - [address] [place] [address] Phone: [phone] Fax: [phone]   Has the prescription been filled recently? No  Is the patient out of the medication? Yes  Has the patient been seen for an appointment in the last year OR does the patient have an upcoming appointment? Yes  Can we respond through MyChart? Yes  Agent: Please be advised that Rx refills may take up to 3 business days. We ask that you follow-up with your pharmacy.

## 2023-06-10 NOTE — Telephone Encounter (Signed)
Requested by interface surescripts. Already signed 06/10/23 . Duplicate .Future visit in 2 months Requested Prescriptions  Refused Prescriptions Disp Refills   pantoprazole (PROTONIX) 40 MG tablet [Pharmacy Med Name: pantoprazole 40 mg tablet,delayed release] 90 tablet 1    Sig: TAKE 1 TABLET BY MOUTH ONCE DAILY 30 MINUTES BEFORE BREAKFAST     Gastroenterology: Proton Pump Inhibitors Failed - 06/09/2023  6:24 PM      Failed - Valid encounter within last 12 months    Recent Outpatient Visits           2 years ago Routine general medical examination at a health care facility   [facility] Pickard, Priscille Heidelberg, MD   3 years ago Routine general medical examination at a health care facility   [facility] Pickard, Priscille Heidelberg, MD   4 years ago Routine general medical examination at a health care facility   [facility] Pickard, Priscille Heidelberg, MD   5 years ago Pure hypercholesterolemia   [facility] Tanya Nones, Priscille Heidelberg, MD   6 years ago Pure hypercholesterolemia   [facility] Pickard, Priscille Heidelberg, MD       Future Appointments             In 2 months Pickard, Priscille Heidelberg, MD [facility], [facility]

## 2023-06-17 ENCOUNTER — Ambulatory Visit (INDEPENDENT_AMBULATORY_CARE_PROVIDER_SITE_OTHER): Payer: Medicare Other | Admitting: Family Medicine

## 2023-06-17 ENCOUNTER — Encounter: Payer: Self-pay | Admitting: Family Medicine

## 2023-06-17 VITALS — BP 140/82 | HR 71 | Temp 97.6°F | Ht 73.0 in | Wt 147.4 lb

## 2023-06-17 DIAGNOSIS — R197 Diarrhea, unspecified: Secondary | ICD-10-CM

## 2023-06-17 NOTE — Progress Notes (Signed)
Subjective:  HPI: Albert Garner is a 82 y.o. male presenting on 06/17/2023 for Diarrhea (X 2 days ) and Emesis (X2 days )   Diarrhea  Associated symptoms include vomiting.  Emesis  Associated symptoms include diarrhea.   Patient is in today for 2 days vomiting and diarrhea. He did eat some peanuts from [employer] prior to feeling ill and he started vomiting at 2am that night.His symptoms have resolved since Saturday AM. He had about 3 episodes of diarrhea yesterday. His daughter reports he has been sleeping more. He reports his stool has been black. He has been drinking ensure. No known sick exposures. Denies fever, chills body aches, abdominal pain.  Review of Systems  Gastrointestinal:  Positive for diarrhea and vomiting.  All other systems reviewed and are negative.   Relevant past medical history reviewed and updated as indicated.   Past Medical History:  Diagnosis Date   Allergy    occasional seasonal allergies   Carotid artery occlusion    Hypercholesteremia    Hyperlipidemia    Hypertension      Past Surgical History:  Procedure Laterality Date   COLONOSCOPY     2003 w/santagade-no report in epic   COLONOSCOPY  01/2015   Dr. Christella Hartigan: four polyps removed, 3 tubular adenomas. next tcs 3 years   COLONOSCOPY N/A 01/31/2018   diverticulosis, internal hemorrhoids.    ESOPHAGOGASTRODUODENOSCOPY N/A 03/09/2017   Dr. Jena Gauss: Distal esophagus quite macerated, Schatzki ring not dilated, retained food in the hernia sac.   ESOPHAGOGASTRODUODENOSCOPY N/A 04/11/2017   Dr. 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   FOREIGN BODY REMOVAL N/A 03/09/2017   Procedure: FOREIGN BODY REMOVAL;  Surgeon: Corbin Ade, MD;  [facility];  Service: Endoscopy;  Laterality: N/A;   INGUINAL HERNIA REPAIR Right 1986   MALONEY DILATION N/A 04/11/2017   Procedure: MALONEY DILATION;  Surgeon: Corbin Ade, MD;  [facility];  Service: Endoscopy;  Laterality: N/A;   MALONEY DILATION N/A 11/13/2019   Procedure: Elease Hashimoto DILATION;  Surgeon: Corbin Ade, MD;  [facility];  Service: Endoscopy;  Laterality: N/A;   MOUTH SURGERY     with sedation-bottom teeth pulled    Allergies and medications reviewed and updated.   Current Outpatient Medications:    aspirin 81 MG tablet, Take 81 mg by mouth daily., Disp: , Rfl:    atorvastatin (LIPITOR) 40 MG tablet, TAKE ONE TABLET BY MOUTH ONCE DAILY., Disp: 90 tablet, Rfl: 3   Nutritional Supplements (ENSURE ORIGINAL PO), Take 237 mLs by mouth 3 (three) times daily as needed (nutritional support). Drinks 1-3 daily, Disp: , Rfl:    pantoprazole (PROTONIX) 40 MG tablet, TAKE 1 TABLET BY MOUTH ONCE DAILY 30 MINUTES BEFORE BREAKFAST, Disp: 90 tablet, Rfl: 1   traZODone (DESYREL) 50 MG tablet, TAKE 2 TABLETS AT BEDTIME DAILY., Disp: 180 tablet, Rfl: 0   lisinopril (ZESTRIL) 10 MG tablet, Take 1 tablet (10 mg total) by mouth daily. (Patient not taking: Reported on 06/17/2023), Disp: 90 tablet, Rfl: 3   triamcinolone cream (KENALOG) 0.1 %, Apply 1 application topically 2 (two) times daily. (Patient not taking: Reported on 06/17/2023), Disp: 30 g, Rfl: 1  No Known Allergies  Objective:  BP (!) 140/82 (BP Location: Left Arm)   Pulse 71   Temp 97.6 F (36.4 C)   Ht 6\' 1"  (1.854 m)   Wt 147 lb 6 oz (66.8 kg)   SpO2 98%   BMI 19.44 kg/m      06/17/2023    3:58 PM 03/07/2023   11:15 AM 02/07/2023    7:57 AM  Vitals with BMI  Height 6\' 1"  6\' 1"  6\' 1"   Weight 147 lbs 6 oz 146 lbs 146 lbs 3 oz  BMI 19.45 19.27 19.29  Systolic 140 112 578  Diastolic 82 56 62  Pulse 71 71 65     Physical Exam Vitals and nursing note reviewed.  Constitutional:       Appearance: Normal appearance. He is normal weight.  HENT:     Head: Normocephalic and atraumatic.  Cardiovascular:     Rate and Rhythm: Normal rate and regular rhythm.     Pulses: Normal pulses.     Heart sounds: Normal heart sounds.  Pulmonary:     Effort: Pulmonary effort is normal.     Breath sounds: Normal breath sounds.  Abdominal:     General: Abdomen is flat. Bowel sounds are normal.     Palpations: Abdomen is soft.  Skin:    General: Skin is warm and dry.     Capillary Refill: Capillary refill takes less than 2 seconds.  Neurological:     General: No focal deficit present.     Mental Status: He is alert and oriented to person, place, and time. Mental status is at baseline.  Psychiatric:        Mood and Affect: Mood normal.        Behavior: Behavior normal.        Thought Content: Thought content normal.        Judgment: Judgment normal.     Assessment & Plan:  Diarrhea, unspecified type Assessment & Plan: He reports symptoms are much improved and lasted approximately 24 hours. He continues to feel fatigued with decreased appetite. Will obtain CMP and CBC today with stool studies if diarrhea continues. Encouraged to push electrolyte fluids and slowly increase diet as tolerated. Seek medical care if symptoms return or worsen.  Orders: -     COMPLETE METABOLIC PANEL WITH GFR -     CBC with Differential/Platelet -     Ova and parasite examination -     Gastrointestinal Pathogen Pnl RT, PCR -     Fecal Globin By Immunochemistry     Follow up plan: Return if symptoms worsen or fail to improve.  Park Meo, FNP

## 2023-06-17 NOTE — Assessment & Plan Note (Signed)
He reports symptoms are much improved and lasted approximately 24 hours. He continues to feel fatigued with decreased appetite. Will obtain CMP and CBC today with stool studies if diarrhea continues. Encouraged to push electrolyte fluids and slowly increase diet as tolerated. Seek medical care if symptoms return or worsen.

## 2023-06-18 LAB — CBC WITH DIFFERENTIAL/PLATELET
Absolute Lymphocytes: 762 {cells}/uL — ABNORMAL LOW (ref 850–3900)
Absolute Monocytes: 660 {cells}/uL (ref 200–950)
Basophils Absolute: 18 {cells}/uL (ref 0–200)
Basophils Relative: 0.3 %
Eosinophils Absolute: 42 {cells}/uL (ref 15–500)
Eosinophils Relative: 0.7 %
HCT: 36.8 % — ABNORMAL LOW (ref 38.5–50.0)
Hemoglobin: 12.3 g/dL — ABNORMAL LOW (ref 13.2–17.1)
MCH: 31.5 pg (ref 27.0–33.0)
MCHC: 33.4 g/dL (ref 32.0–36.0)
MCV: 94.1 fL (ref 80.0–100.0)
MPV: 10.4 fL (ref 7.5–12.5)
Monocytes Relative: 11 %
Neutro Abs: 4518 {cells}/uL (ref 1500–7800)
Neutrophils Relative %: 75.3 %
Platelets: 136 10*3/uL — ABNORMAL LOW (ref 140–400)
RBC: 3.91 10*6/uL — ABNORMAL LOW (ref 4.20–5.80)
RDW: 12.2 % (ref 11.0–15.0)
Total Lymphocyte: 12.7 %
WBC: 6 10*3/uL (ref 3.8–10.8)

## 2023-06-18 LAB — COMPLETE METABOLIC PANEL WITH GFR
AG Ratio: 1.3 (calc) (ref 1.0–2.5)
ALT: 16 U/L (ref 9–46)
AST: 27 U/L (ref 10–35)
Albumin: 3.5 g/dL — ABNORMAL LOW (ref 3.6–5.1)
Alkaline phosphatase (APISO): 73 U/L (ref 35–144)
BUN/Creatinine Ratio: 27 (calc) — ABNORMAL HIGH (ref 6–22)
BUN: 31 mg/dL — ABNORMAL HIGH (ref 7–25)
CO2: 31 mmol/L (ref 20–32)
Calcium: 8.6 mg/dL (ref 8.6–10.3)
Chloride: 102 mmol/L (ref 98–110)
Creat: 1.16 mg/dL (ref 0.70–1.22)
Globulin: 2.6 g/dL (ref 1.9–3.7)
Glucose, Bld: 124 mg/dL — ABNORMAL HIGH (ref 65–99)
Potassium: 4.3 mmol/L (ref 3.5–5.3)
Sodium: 141 mmol/L (ref 135–146)
Total Bilirubin: 0.8 mg/dL (ref 0.2–1.2)
Total Protein: 6.1 g/dL (ref 6.1–8.1)
eGFR: 63 mL/min/{1.73_m2} (ref >=60)

## 2023-07-01 ENCOUNTER — Encounter: Payer: Self-pay | Admitting: Family Medicine

## 2023-07-01 ENCOUNTER — Ambulatory Visit: Payer: Medicare Other | Admitting: Family Medicine

## 2023-07-01 ENCOUNTER — Other Ambulatory Visit: Payer: Self-pay | Admitting: Family Medicine

## 2023-07-01 VITALS — BP 136/72 | HR 72 | Temp 97.9°F | Ht 73.0 in | Wt 151.2 lb

## 2023-07-01 DIAGNOSIS — Z0000 Encounter for general adult medical examination without abnormal findings: Secondary | ICD-10-CM | POA: Diagnosis not present

## 2023-07-01 NOTE — Progress Notes (Signed)
Subjective:   Albert Garner is a 82 y.o. male who presents for Medicare Annual/Subsequent preventive examination.  Visit Complete: In person  Patient Medicare AWV questionnaire was completed by the patient on 07/01/2023; I have confirmed that all information answered by patient is correct and no changes since this date.  Cardiac Risk Factors include: advanced age (>5men, >25 women);sedentary lifestyle;male gender Patient refuses a flu shot.  He refuses a shingles shot.  He declines Prevnar 20.  Due to his age he does not require prostate cancer screening or colon cancer screening.  He declines a COVID-vaccine.  He denies any recent falls or memory loss or depression.    Objective:    Today's Vitals   07/01/23 0928  BP: 136/72  Pulse: 72  Temp: 97.9 F (36.6 C)  SpO2: 96%  Weight: 151 lb 3.2 oz (68.6 kg)  Height: 6\' 1"  (1.854 m)   Body mass index is 19.95 kg/m.     07/01/2023    9:46 AM 06/21/2022   10:28 AM 06/02/2021    8:04 AM 05/26/2020    8:06 AM 11/13/2019   11:02 AM 01/31/2018    6:30 AM 04/30/2017    3:30 PM  Advanced Directives  Does Patient Have a Medical Advance Directive? Yes Yes Yes Yes Yes Yes Yes  Type of Estate agent of [place];Living will Living will;Healthcare Power of Attorney Living will Healthcare Power of [place];Living will;Out of facility DNR (pink MOST or yellow form) Living will Healthcare Power of [place];Living will Healthcare Power of [place];Living will  Does patient want to make changes to medical advance directive?  No - Patient declined No - Patient declined      Copy of Healthcare Power of Attorney in Chart? No - copy requested No - copy requested    No - copy requested     Current Medications (verified) Outpatient Encounter Medications as of 07/01/2023  Medication Sig   aspirin 81 MG tablet Take 81 mg by mouth daily.   atorvastatin (LIPITOR) 40 MG tablet TAKE ONE TABLET BY MOUTH ONCE DAILY.   Nutritional  Supplements (ENSURE ORIGINAL PO) Take 237 mLs by mouth 3 (three) times daily as needed (nutritional support). Drinks 1-3 daily   pantoprazole (PROTONIX) 40 MG tablet TAKE 1 TABLET BY MOUTH ONCE DAILY 30 MINUTES BEFORE BREAKFAST   traZODone (DESYREL) 50 MG tablet TAKE 2 TABLETS AT BEDTIME DAILY.   triamcinolone cream (KENALOG) 0.1 % Apply 1 application topically 2 (two) times daily.   lisinopril (ZESTRIL) 10 MG tablet Take 1 tablet (10 mg total) by mouth daily. (Patient not taking: Reported on 06/17/2023)   No facility-administered encounter medications on file as of 07/01/2023.    Allergies (verified) Patient has no known allergies.   History: Past Medical History:  Diagnosis Date   Allergy    occasional seasonal allergies   Carotid artery occlusion    Hypercholesteremia    Hyperlipidemia    Hypertension    Past Surgical History:  Procedure Laterality Date   COLONOSCOPY     2003 w/santagade-no report in epic   COLONOSCOPY  01/2015   Dr. Christella Hartigan: four polyps removed, 3 tubular adenomas. next tcs 3 years   COLONOSCOPY N/A 01/31/2018   diverticulosis, internal hemorrhoids.    ESOPHAGOGASTRODUODENOSCOPY N/A 03/09/2017   Dr. Jena Gauss: Distal esophagus quite macerated, Schatzki ring not dilated, retained food in the hernia sac.   ESOPHAGOGASTRODUODENOSCOPY N/A 04/11/2017   Dr. 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   FOREIGN BODY REMOVAL N/A 03/09/2017   Procedure: FOREIGN BODY REMOVAL;  Surgeon: Corbin Ade, MD;  [facility];  Service: Endoscopy;  Laterality: N/A;   INGUINAL HERNIA REPAIR Right 1986   MALONEY DILATION N/A 04/11/2017   Procedure: MALONEY DILATION;  Surgeon: Corbin Ade, MD;  [facility];  Service: Endoscopy;  Laterality: N/A;   MALONEY DILATION N/A 11/13/2019   Procedure: Elease Hashimoto DILATION;  Surgeon: Corbin Ade, MD;  [facility];  Service: Endoscopy;  Laterality: N/A;   MOUTH SURGERY     with sedation-bottom teeth pulled   Family History  Problem Relation Age of Onset   Heart disease Mother    Throat cancer Brother    Colon cancer Neg Hx    Rectal cancer Neg Hx    Stomach cancer Neg Hx    Social History   Socioeconomic History   Marital status: Married    Spouse name: Not on file   Number of children: Not on file   Years of education: Not on file   Highest education level: Not on file  Occupational History   Not on file  Tobacco Use   Smoking status: Never    Passive exposure: Never   Smokeless tobacco: Current    Types: Chew  Vaping Use   Vaping status: Never Used  Substance and Sexual Activity   Alcohol use: No   Drug use: No   Sexual activity: Yes  Other Topics Concern   Not on file  Social History Narrative   Not on file   Social Determinants of Health   Financial Resource Strain: Low Risk  (07/01/2023)   Overall Financial Resource Strain (CARDIA)    Difficulty of Paying Living Expenses: Not hard at all  Food Insecurity: No Food Insecurity (07/01/2023)   Hunger Vital Sign    Worried About Running Out of Food in the Last Year: Never true    Ran Out of Food in the Last Year: Never true  Transportation Needs: No Transportation Needs (07/01/2023)   PRAPARE - Administrator, Civil Service (Medical): No    Lack of Transportation (Non-Medical): No  Physical Activity: Insufficiently Active (07/01/2023)   Exercise Vital Sign    Days of Exercise per Week: 7 days    Minutes of Exercise per Session: 20 min  Stress: No Stress Concern Present (07/01/2023)   [employer] of Occupational Health - Occupational Stress Questionnaire    Feeling of Stress : Not at all  Social Connections: Socially Integrated (07/01/2023)    Social Connection and Isolation Panel [NHANES]    Frequency of Communication with Friends and Family: More than three times a week    Frequency of Social Gatherings with Friends and Family: More than three times a week    Attends Religious Services: More than 4 times per year    Active Member of [employer] or Organizations: Yes    Attends Engineer, structural: More than 4 times per year    Marital Status: Married    Tobacco Counseling Ready to quit: Not Answered Counseling given: Not Answered   Clinical Intake:  Pre-visit preparation completed: Yes  Pain : No/denies pain     BMI - recorded: 19.95 Nutritional Status:  BMI of 19-24  Normal Nutritional Risks: None Diabetes: No  How often do you need to have someone help you when you read instructions, pamphlets, or other written materials from your doctor or pharmacy?: 1 - Never  Interpreter Needed?: No  Information entered by :: mj Kizzi Overbey, lpn   Activities of Daily Living    07/01/2023    9:46 AM  In your present state of health, do you have any difficulty performing the following activities:  Hearing? 1  Vision? 1  Difficulty concentrating or making decisions? 0  Walking or climbing stairs? 0  Dressing or bathing? 0  Doing errands, shopping? 0  Preparing Food and eating ? N  Using the Toilet? N  In the past six months, have you accidently leaked urine? N  Do you have problems with loss of bowel control? N  Managing your Medications? N  Managing your Finances? N  Housekeeping or managing your Housekeeping? N    Patient Care Team: Donita Brooks, MD as PCP - General (Family Medicine) [facility], [facility] Jena Gauss, Gerrit Friends, MD as Consulting Physician (Gastroenterology)  Indicate any recent Medical Services you may have received from other than [facility] providers in the past year (date may be approximate).     Assessment:   This is a routine wellness examination for Dejesus.  Hearing/Vision screen Hearing  Screening - Comments:: Wears bilateral hearing aids. Vision Screening - Comments:: Readers.    Goals Addressed             This Visit's Progress    Patient Stated       Maintain independence-continue in 2025. 07/01/23. Mjp,lpn       Depression Screen    07/01/2023    9:39 AM 06/17/2023    4:01 PM 02/07/2023    8:17 AM 08/09/2022    8:06 AM 06/21/2022   10:26 AM 06/02/2021    8:02 AM 05/26/2020    8:07 AM  PHQ 2/9 Scores  PHQ - 2 Score 0 0 0 0 0 0 0  PHQ- 9 Score      4     Fall Risk    07/01/2023    9:46 AM 06/17/2023    4:01 PM 02/07/2023    8:17 AM 08/09/2022    8:06 AM 06/21/2022   10:25 AM  Fall Risk   Falls in the past year? 0 0 0 0 0  Number falls in past yr: 0 0 0 0 0  Injury with Fall? 0 0 0 0 0  Risk for fall due to : No Fall Risks  No Fall Risks No Fall Risks   Follow up Falls prevention discussed  Falls prevention discussed Falls prevention discussed Falls evaluation completed;Education provided;Falls prevention discussed    MEDICARE RISK AT HOME:    TIMED UP AND GO:  Was the test performed?  Yes  Length of time to ambulate 10 feet: 8 sec Gait slow and steady without use of assistive device    Cognitive Function:        07/01/2023    9:47 AM 06/21/2022   10:29 AM 05/26/2020    8:08 AM  6CIT Screen  What Year? 0 points 0 points 0 points  What month? 0 points 0 points 0 points  What time? 0 points 0 points 0 points  Count back from 20 0 points 0 points 0 points  Months in reverse 0 points 2 points 0 points  Repeat phrase 0 points 0 points 0 points  Total Score 0 points 2 points 0 points    Immunizations Immunization History  Administered Date(s) Administered   PFIZER(Purple Top)SARS-COV-2 Vaccination 03/09/2020, 03/30/2020   Pneumococcal Conjugate-13 10/27/2015   Tdap 05/27/2020    TDAP status: Up to date  Flu Vaccine status: Declined, Education has been provided regarding the importance of this vaccine but patient still declined.  Advised may receive this vaccine at local pharmacy or Health Dept. Aware to provide a copy of the vaccination record if obtained from local pharmacy or Health Dept. Verbalized acceptance and understanding.  Pneumococcal vaccine status: Declined,  Education has been provided regarding the importance of this vaccine but patient still declined. Advised may receive this vaccine at local pharmacy or Health Dept. Aware to provide a copy of the vaccination record if obtained from local pharmacy or Health Dept. Verbalized acceptance and understanding.   Covid-19 vaccine status: Declined, Education has been provided regarding the importance of this vaccine but patient still declined. Advised may receive this vaccine at local pharmacy or Health Dept.or vaccine clinic. Aware to provide a copy of the vaccination record if obtained from local pharmacy or Health Dept. Verbalized acceptance and understanding.  Qualifies for Shingles Vaccine? Yes   Zostavax completed No   Shingrix Completed?: No.    Education has been provided regarding the importance of this vaccine. Patient has been advised to call insurance company to determine out of pocket expense if they have not yet received this vaccine. Advised may also receive vaccine at local pharmacy or Health Dept. Verbalized acceptance and understanding.  Screening Tests Health Maintenance  Topic Date Due   COVID-19 Vaccine (3 - 2023-24 season) 07/03/2023 (Originally 03/24/2023)   Zoster Vaccines- Shingrix (1 of 2) 09/17/2023 (Originally 01/01/1991)   INFLUENZA VACCINE  10/21/2023 (Originally 02/21/2023)   Colonoscopy  02/07/2024 (Originally 01/31/2021)   Pneumonia Vaccine 35+ Years old (2 of 2 - PPSV23 or PCV20) 06/30/2024 (Originally 12/22/2015)   Medicare Annual Wellness (AWV)  06/30/2024   DTaP/Tdap/Td (2 - Td or Tdap) 05/27/2030   HPV VACCINES  Aged Out    Health Maintenance  There are no preventive care reminders to display for this patient.   Colorectal  cancer screening: No longer required.   Lung Cancer Screening: (Low Dose CT Chest recommended if Age 25-80 years, 20 pack-year currently smoking OR have quit w/in 15years.) does not qualify.   Lung Cancer Screening Referral: N/A  Additional Screening:  Hepatitis C Screening: does not qualify; Completed   Vision Screening: Recommended annual ophthalmology exams for early detection of glaucoma and other disorders of the eye. Is the patient up to date with their annual eye exam?  Yes  Who is the provider or what is the name of the office in which the patient attends annual eye exams? Dr. Nile Riggs If pt is not established with a provider, would they like to be referred to a provider to establish care? No .   Dental Screening: Recommended annual dental exams for proper oral hygiene  Diabetic Foot Exam: N/A  Community Resource Referral / Chronic Care Management: CRR required this visit?  No   CCM required this visit?  No     Plan:     I have personally reviewed and noted the following in the patient's chart:   Medical and social history Use of alcohol, tobacco or illicit drugs  Current medications and supplements including opioid prescriptions. Patient is not currently taking opioid prescriptions. Functional ability and status Nutritional status Physical activity Advanced directives List of  other physicians Hospitalizations, surgeries, and ER visits in previous 12 months Vitals Screenings to include cognitive, depression, and falls Referrals and appointments  In addition, I have reviewed and discussed with patient certain preventive protocols, quality metrics, and best practice recommendations. A written personalized care plan for preventive services as well as general preventive health recommendations were provided to patient.     Darral Dash, LPN   40/03/8118   After Visit Summary: (In Person-Printed) AVS printed and given to the patient  Nurse Notes: Discussed  vaccines. Pt declined.    I have collaborated with the care management provider regarding care management and care coordination activities outlined in this encounter and have reviewed this encounter including documentation in the note and care plan. I am certifying that I agree with the content of this note and encounter as supervising physician.

## 2023-07-04 NOTE — Final Progress Note (Signed)
I have collaborated with the care management provider regarding care management and care coordination activities outlined in this encounter and have reviewed this encounter including documentation in the note and care plan. I am certifying that I agree with the content of this note and encounter as supervising physician.  

## 2023-08-12 ENCOUNTER — Ambulatory Visit: Payer: Medicare Other | Admitting: Family Medicine

## 2023-08-15 ENCOUNTER — Ambulatory Visit (INDEPENDENT_AMBULATORY_CARE_PROVIDER_SITE_OTHER): Payer: Medicare Other | Admitting: Family Medicine

## 2023-08-15 VITALS — BP 120/76 | HR 64 | Temp 97.8°F | Ht 73.0 in | Wt 151.2 lb

## 2023-08-15 DIAGNOSIS — I10 Essential (primary) hypertension: Secondary | ICD-10-CM

## 2023-08-15 DIAGNOSIS — E7800 Pure hypercholesterolemia, unspecified: Secondary | ICD-10-CM | POA: Diagnosis not present

## 2023-08-15 DIAGNOSIS — Z0001 Encounter for general adult medical examination with abnormal findings: Secondary | ICD-10-CM | POA: Diagnosis not present

## 2023-08-15 DIAGNOSIS — Z0000 Encounter for general adult medical examination without abnormal findings: Secondary | ICD-10-CM

## 2023-08-15 NOTE — Progress Notes (Signed)
Subjective:    Patient ID: Albert Garner, male    DOB: 04/02/1941, 83 y.o.   MRN: [id number]  HPI  Patient is here today for complete physical exam.  He is due for a flu shot, the shingles vaccine, and  capvaxive.  He declines all immunizations.  Due to age does not require colonoscopy or PSA.  He denies any chest pain shortness of breath or dyspnea on exertion.  He denies any memory loss, falls, or depression.  He stopped his lisinopril and his blood pressure remains well-controlled. Immunization History  Administered Date(s) Administered   PFIZER(Purple Top)SARS-COV-2 Vaccination 03/09/2020, 03/30/2020   Pneumococcal Conjugate-13 10/27/2015   Tdap 05/27/2020    Past Medical History:  Diagnosis Date   Allergy    occasional seasonal allergies   Carotid artery occlusion    Hypercholesteremia    Hyperlipidemia    Hypertension    Past Surgical History:  Procedure Laterality Date   COLONOSCOPY     2003 w/santagade-no report in epic   COLONOSCOPY  01/2015   Dr. Christella Hartigan: four polyps removed, 3 tubular adenomas. next tcs 3 years   COLONOSCOPY N/A 01/31/2018   diverticulosis, internal hemorrhoids.    ESOPHAGOGASTRODUODENOSCOPY N/A 03/09/2017   Dr. Jena Gauss: Distal esophagus quite macerated, Schatzki ring not dilated, retained food in the hernia sac.   ESOPHAGOGASTRODUODENOSCOPY N/A 04/11/2017   Dr. 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   FOREIGN BODY REMOVAL N/A 03/09/2017   Procedure: FOREIGN BODY REMOVAL;  Surgeon: Corbin Ade, MD;  [facility];  Service: Endoscopy;  Laterality: N/A;   INGUINAL HERNIA REPAIR Right 1986   MALONEY DILATION N/A 04/11/2017   Procedure: MALONEY DILATION;  Surgeon: Corbin Ade, MD;   [facility];  Service: Endoscopy;  Laterality: N/A;   MALONEY DILATION N/A 11/13/2019   Procedure: Elease Hashimoto DILATION;  Surgeon: Corbin Ade, MD;  [facility];  Service: Endoscopy;  Laterality: N/A;   MOUTH SURGERY     with sedation-bottom teeth pulled   Current Outpatient Medications on File Prior to Visit  Medication Sig Dispense Refill   aspirin 81 MG tablet Take 81 mg by mouth daily.     atorvastatin (LIPITOR) 40 MG tablet TAKE ONE TABLET BY MOUTH ONCE DAILY. 90 tablet 3   Nutritional Supplements (ENSURE ORIGINAL PO) Take 237 mLs by mouth 3 (three) times daily as needed (nutritional support). Drinks 1-3 daily     pantoprazole (PROTONIX) 40 MG tablet TAKE 1 TABLET BY MOUTH ONCE DAILY 30 MINUTES BEFORE BREAKFAST 90 tablet 1   traZODone (DESYREL) 50 MG tablet TAKE 2 TABLETS AT BEDTIME DAILY. 180 tablet 0   triamcinolone cream (KENALOG) 0.1 % Apply 1 application topically 2 (two) times daily. 30 g 1   No current facility-administered medications on file prior to visit.   No Known Allergies Social History   Socioeconomic History   Marital status: Married    Spouse name: Not on file   Number of children: Not on file   Years of education: Not on file   Highest education level: Not on file  Occupational History   Not on file  Tobacco Use   Smoking status: Never    Passive exposure: Never  Smokeless tobacco: Current    Types: Chew  Vaping Use   Vaping status: Never Used  Substance and Sexual Activity   Alcohol use: No   Drug use: No   Sexual activity: Yes  Other Topics Concern   Not on file  Social History Narrative   Not on file   Social Drivers of Health   Financial Resource Strain: Low Risk  (07/01/2023)   Overall Financial Resource Strain (CARDIA)    Difficulty of Paying Living Expenses: Not hard at all  Food Insecurity: No Food Insecurity (07/01/2023)   Hunger Vital Sign    Worried About Running Out of Food in the Last Year: Never true     Ran Out of Food in the Last Year: Never true  Transportation Needs: No Transportation Needs (07/01/2023)   PRAPARE - Administrator, Civil Service (Medical): No    Lack of Transportation (Non-Medical): No  Physical Activity: Insufficiently Active (07/01/2023)   Exercise Vital Sign    Days of Exercise per Week: 7 days    Minutes of Exercise per Session: 20 min  Stress: No Stress Concern Present (07/01/2023)   [employer] of Occupational Health - Occupational Stress Questionnaire    Feeling of Stress : Not at all  Social Connections: Socially Integrated (07/01/2023)   Social Connection and Isolation Panel [NHANES]    Frequency of Communication with Friends and Family: More than three times a week    Frequency of Social Gatherings with Friends and Family: More than three times a week    Attends Religious Services: More than 4 times per year    Active Member of [employer] or Organizations: Yes    Attends Engineer, structural: More than 4 times per year    Marital Status: Married  Catering manager Violence: Not At Risk (07/01/2023)   Humiliation, Afraid, Rape, and Kick questionnaire    Fear of Current or Ex-Partner: No    Emotionally Abused: No    Physically Abused: No    Sexually Abused: No   Family History  Problem Relation Age of Onset   Heart disease Mother    Throat cancer Brother    Colon cancer Neg Hx    Rectal cancer Neg Hx    Stomach cancer Neg Hx       Review of Systems  All other systems reviewed and are negative.      Objective:   Physical Exam Vitals reviewed.  Constitutional:      General: He is not in acute distress.    Appearance: He is well-developed. He is not diaphoretic.  HENT:     Head: Normocephalic and atraumatic.     Right Ear: External ear normal.     Left Ear: External ear normal.     Nose: Nose normal.     Mouth/Throat:     Pharynx: No oropharyngeal exudate.  Eyes:     General: No scleral icterus.       Right eye: No  discharge.        Left eye: No discharge.     Conjunctiva/sclera: Conjunctivae normal.     Pupils: Pupils are equal, round, and reactive to light.  Neck:     Thyroid: No thyromegaly.     Vascular: No JVD.     Trachea: No tracheal deviation.  Cardiovascular:     Rate and Rhythm: Normal rate and regular rhythm.     Heart sounds: Normal heart sounds. No murmur heard.    No friction  rub. No gallop.  Pulmonary:     Effort: Pulmonary effort is normal. No respiratory distress.     Breath sounds: Normal breath sounds. No stridor. No wheezing or rales.  Chest:     Chest wall: No tenderness.  Abdominal:     General: Bowel sounds are normal. There is no distension.     Palpations: Abdomen is soft. There is no mass.     Tenderness: There is no abdominal tenderness. There is no guarding or rebound.  Musculoskeletal:        General: No tenderness. Normal range of motion.     Cervical back: Normal range of motion and neck supple.  Lymphadenopathy:     Cervical: No cervical adenopathy.  Skin:    General: Skin is warm.     Coloration: Skin is not pale.     Findings: No erythema or rash.  Neurological:     Mental Status: He is alert and oriented to person, place, and time.     Cranial Nerves: No cranial nerve deficit.     Motor: No abnormal muscle tone.     Coordination: Coordination normal.     Deep Tendon Reflexes: Reflexes are normal and symmetric.  Psychiatric:        Behavior: Behavior normal.        Thought Content: Thought content normal.        Judgment: Judgment normal.         Assessment & Plan:  Hypercholesterolemia - Plan: CBC with Differential/Platelet, COMPLETE METABOLIC PANEL WITH GFR, Lipid panel  Benign essential HTN  General medical exam Physical exam today is normal.  Blood pressure is excellent.  I tried to convince the patient to get the pneumonia vaccine and the shingles shot but he politely declined.  Cancer screening is not required due to age.  Check CBC CMP  fasting lipid panel.  I would like to maintain his LDL cholesterol less than 70.

## 2023-08-16 LAB — COMPLETE METABOLIC PANEL WITH GFR
AG Ratio: 1.4 (calc) (ref 1.0–2.5)
ALT: 12 U/L (ref 9–46)
AST: 21 U/L (ref 10–35)
Albumin: 4 g/dL (ref 3.6–5.1)
Alkaline phosphatase (APISO): 75 U/L (ref 35–144)
BUN/Creatinine Ratio: 14 (calc) (ref 6–22)
BUN: 17 mg/dL (ref 7–25)
CO2: 31 mmol/L (ref 20–32)
Calcium: 9.4 mg/dL (ref 8.6–10.3)
Chloride: 106 mmol/L (ref 98–110)
Creat: 1.23 mg/dL — ABNORMAL HIGH (ref 0.70–1.22)
Globulin: 2.9 g/dL (ref 1.9–3.7)
Glucose, Bld: 89 mg/dL (ref 65–99)
Potassium: 4.5 mmol/L (ref 3.5–5.3)
Sodium: 144 mmol/L (ref 135–146)
Total Bilirubin: 0.6 mg/dL (ref 0.2–1.2)
Total Protein: 6.9 g/dL (ref 6.1–8.1)
eGFR: 59 mL/min/{1.73_m2} — ABNORMAL LOW (ref >=60)

## 2023-08-16 LAB — LIPID PANEL
Cholesterol: 149 mg/dL (ref <200)
HDL: 47 mg/dL (ref >=40)
LDL Cholesterol (Calc): 84 mg/dL
Non-HDL Cholesterol (Calc): 102 mg/dL (ref <130)
Total CHOL/HDL Ratio: 3.2 (calc) (ref <5.0)
Triglycerides: 89 mg/dL (ref <150)

## 2023-08-16 LAB — CBC WITH DIFFERENTIAL/PLATELET
Absolute Lymphocytes: 1311 {cells}/uL (ref 850–3900)
Absolute Monocytes: 396 {cells}/uL (ref 200–950)
Basophils Absolute: 9 {cells}/uL (ref 0–200)
Basophils Relative: 0.2 %
Eosinophils Absolute: 87 {cells}/uL (ref 15–500)
Eosinophils Relative: 1.9 %
HCT: 38.4 % — ABNORMAL LOW (ref 38.5–50.0)
Hemoglobin: 12.5 g/dL — ABNORMAL LOW (ref 13.2–17.1)
MCH: 31.3 pg (ref 27.0–33.0)
MCHC: 32.6 g/dL (ref 32.0–36.0)
MCV: 96 fL (ref 80.0–100.0)
MPV: 10.9 fL (ref 7.5–12.5)
Monocytes Relative: 8.6 %
Neutro Abs: 2797 {cells}/uL (ref 1500–7800)
Neutrophils Relative %: 60.8 %
Platelets: 133 10*3/uL — ABNORMAL LOW (ref 140–400)
RBC: 4 10*6/uL — ABNORMAL LOW (ref 4.20–5.80)
RDW: 12.3 % (ref 11.0–15.0)
Total Lymphocyte: 28.5 %
WBC: 4.6 10*3/uL (ref 3.8–10.8)

## 2023-09-09 ENCOUNTER — Other Ambulatory Visit: Payer: Self-pay | Admitting: Family Medicine

## 2023-09-09 NOTE — Telephone Encounter (Signed)
Prescription Request  09/09/2023  LOV: 08/15/2023  What is the name of the medication or equipment?   traZODone (DESYREL) 50 MG tablet  **90 day script requested**  Have you contacted your pharmacy to request a refill? Yes   Which pharmacy would you like this sent to?  [facility] [place], [place] - [address] [place] [address] Phone: [phone] Fax: [phone]    Patient notified that their request is being sent to the clinical staff for review and that they should receive a response within 2 business days.   Please advise pharmacist.

## 2023-09-10 MED ORDER — TRAZODONE HCL 50 MG PO TABS: 50.0000 mg | ORAL_TABLET | Freq: Two times a day (BID) | ORAL | 3 refills | Status: DC

## 2023-09-10 NOTE — Telephone Encounter (Signed)
Requested Prescriptions  Pending Prescriptions Disp Refills   traZODone (DESYREL) 50 MG tablet 180 tablet 3    Sig: Take 1 tablet (50 mg total) by mouth 2 (two) times daily.     Psychiatry: Antidepressants - Serotonin Modulator Failed - 09/10/2023 12:17 PM      Failed - Valid encounter within last 6 months    Recent Outpatient Visits           2 years ago Routine general medical examination at a health care facility   [facility] Pickard, Priscille Heidelberg, MD   3 years ago Routine general medical examination at a health care facility   [facility] Pickard, Priscille Heidelberg, MD   4 years ago Routine general medical examination at a health care facility   [facility] Pickard, Priscille Heidelberg, MD   5 years ago Pure hypercholesterolemia   [facility] Tanya Nones, Priscille Heidelberg, MD   6 years ago Pure hypercholesterolemia   [facility] Pickard, Priscille Heidelberg, MD

## 2023-11-28 ENCOUNTER — Other Ambulatory Visit: Payer: Self-pay | Admitting: Family Medicine

## 2023-11-28 DIAGNOSIS — K224 Dyskinesia of esophagus: Secondary | ICD-10-CM

## 2023-11-28 DIAGNOSIS — R1319 Other dysphagia: Secondary | ICD-10-CM

## 2023-12-18 ENCOUNTER — Telehealth: Payer: Self-pay

## 2023-12-18 NOTE — Telephone Encounter (Signed)
 Fax received from [place] requesting recent labs for continuity of care. Mjp,lpn

## 2024-03-22 ENCOUNTER — Other Ambulatory Visit: Payer: Self-pay | Admitting: Family Medicine

## 2024-03-22 DIAGNOSIS — E7800 Pure hypercholesterolemia, unspecified: Secondary | ICD-10-CM

## 2024-05-28 ENCOUNTER — Other Ambulatory Visit: Payer: Self-pay | Admitting: Family Medicine

## 2024-05-28 DIAGNOSIS — K224 Dyskinesia of esophagus: Secondary | ICD-10-CM

## 2024-05-28 DIAGNOSIS — R1319 Other dysphagia: Secondary | ICD-10-CM

## 2024-06-17 DIAGNOSIS — H5203 Hypermetropia, bilateral: Secondary | ICD-10-CM | POA: Diagnosis not present

## 2024-07-09 ENCOUNTER — Ambulatory Visit

## 2024-07-09 DIAGNOSIS — Z0000 Encounter for general adult medical examination without abnormal findings: Secondary | ICD-10-CM | POA: Diagnosis not present

## 2024-07-09 NOTE — Patient Instructions (Signed)
 Albert Garner,  Thank you for taking the time for your Medicare Wellness Visit. I appreciate your continued commitment to your health goals. Please review the care plan we discussed, and feel free to reach out if I can assist you further.  Please note that Annual Wellness Visits do not include a physical exam. Some assessments may be limited, especially if the visit was conducted virtually. If needed, we may recommend an in-person follow-up with your provider.  Ongoing Care Seeing your primary care provider every 3 to 6 months helps us  monitor your health and provide consistent, personalized care.  Referrals If a referral was made during today's visit and you haven't received any updates within two weeks, please contact the referred provider directly to check on the status.  Recommended Screenings:  Health Maintenance  Topic Date Due   Zoster (Shingles) Vaccine (1 of 2) Never done   Pneumococcal Vaccine for age over 47 (2 of 2 - PCV20 or PCV21) 10/26/2016   Colon Cancer Screening  01/31/2021   Flu Shot  Never done   COVID-19 Vaccine (3 - 2025-26 season) 03/23/2024   Medicare Annual Wellness Visit  07/09/2025   DTaP/Tdap/Td vaccine (2 - Td or Tdap) 05/27/2030   Meningitis B Vaccine  Aged Out       07/09/2024   12:25 PM  Advanced Directives  Does Patient Have a Medical Advance Directive? Yes  Type of Advance Directive Healthcare Power of Attorney  Copy of Healthcare Power of Attorney in Chart? No - copy requested    Vision: Annual vision screenings are recommended for early detection of glaucoma, cataracts, and diabetic retinopathy. These exams can also reveal signs of chronic conditions such as diabetes and high blood pressure.  Dental: Annual dental screenings help detect early signs of oral cancer, gum disease, and other conditions linked to overall health, including heart disease and diabetes.  Please see the attached documents for additional preventive care recommendations.     Albert Garner , Thank you for taking time to come for your Medicare Wellness Visit. I appreciate your ongoing commitment to your health goals. Please review the following plan we discussed and let me know if I can assist you in the future.   Screening recommendations/referrals: Colonoscopy:  Recommended yearly ophthalmology/optometry visit for glaucoma screening and checkup Recommended yearly dental visit for hygiene and checkup  Vaccinations: Influenza vaccine:  Pneumococcal vaccine:  Tdap vaccine: Shingles vaccine:        Preventive Care 65 Years and Older, Male Preventive care refers to lifestyle choices and visits with your health care provider that can promote health and wellness. What does preventive care include? A yearly physical exam. This is also called an annual well check. Dental exams once or twice a year. Routine eye exams. Ask your health care provider how often you should have your eyes checked. Personal lifestyle choices, including: Daily care of your teeth and gums. Regular physical activity. Eating a healthy diet. Avoiding tobacco and drug use. Limiting alcohol use. Practicing safe sex. Taking low doses of aspirin every day. Taking vitamin and mineral supplements as recommended by your health care provider. What happens during an annual well check? The services and screenings done by your health care provider during your annual well check will depend on your age, overall health, lifestyle risk factors, and family history of disease. Counseling  Your health care provider may ask you questions about your: Alcohol use. Tobacco use. Drug use. Emotional well-being. Home and relationship well-being. Sexual activity. Eating habits.  History of falls. Memory and ability to understand (cognition). Work and work astronomer. Screening  You may have the following tests or measurements: Height, weight, and BMI. Blood pressure. Lipid and cholesterol levels.  These may be checked every 5 years, or more frequently if you are over 29 years old. Skin check. Lung cancer screening. You may have this screening every year starting at age 34 if you have a 30-pack-year history of smoking and currently smoke or have quit within the past 15 years. Fecal occult blood test (FOBT) of the stool. You may have this test every year starting at age 23. Flexible sigmoidoscopy or colonoscopy. You may have a sigmoidoscopy every 5 years or a colonoscopy every 10 years starting at age 44. Prostate cancer screening. Recommendations will vary depending on your family history and other risks. Hepatitis C blood test. Hepatitis B blood test. Sexually transmitted disease (STD) testing. Diabetes screening. This is done by checking your blood sugar (glucose) after you have not eaten for a while (fasting). You may have this done every 1-3 years. Abdominal aortic aneurysm (AAA) screening. You may need this if you are a current or former smoker. Osteoporosis. You may be screened starting at age 44 if you are at high risk. Talk with your health care provider about your test results, treatment options, and if necessary, the need for more tests. Vaccines  Your health care provider may recommend certain vaccines, such as: Influenza vaccine. This is recommended every year. Tetanus, diphtheria, and acellular pertussis (Tdap, Td) vaccine. You may need a Td booster every 10 years. Zoster vaccine. You may need this after age 21. Pneumococcal 13-valent conjugate (PCV13) vaccine. One dose is recommended after age 64. Pneumococcal polysaccharide (PPSV23) vaccine. One dose is recommended after age 43. Talk to your health care provider about which screenings and vaccines you need and how often you need them. This information is not intended to replace advice given to you by your health care provider. Make sure you discuss any questions you have with your health care provider. Document Released:  08/05/2015 Document Revised: 03/28/2016 Document Reviewed: 05/10/2015 Elsevier Interactive Patient Education  2017 [employer].  Fall Prevention in the Home Falls can cause injuries. They can happen to people of all ages. There are many things you can do to make your home safe and to help prevent falls. What can I do on the outside of my home? Regularly fix the edges of walkways and driveways and fix any cracks. Remove anything that might make you trip as you walk through a door, such as a raised step or threshold. Trim any bushes or trees on the path to your home. Use bright outdoor lighting. Clear any walking paths of anything that might make someone trip, such as rocks or tools. Regularly check to see if handrails are loose or broken. Make sure that both sides of any steps have handrails. Any raised decks and porches should have guardrails on the edges. Have any leaves, snow, or ice cleared regularly. Use sand or salt on walking paths during winter. Clean up any spills in your garage right away. This includes oil or grease spills. What can I do in the bathroom? Use night lights. Install grab bars by the toilet and in the tub and shower. Do not use towel bars as grab bars. Use non-skid mats or decals in the tub or shower. If you need to sit down in the shower, use a plastic, non-slip stool. Keep the floor dry. Clean up any  water  that spills on the floor as soon as it happens. Remove soap buildup in the tub or shower regularly. Attach bath mats securely with double-sided non-slip rug tape. Do not have throw rugs and other things on the floor that can make you trip. What can I do in the bedroom? Use night lights. Make sure that you have a light by your bed that is easy to reach. Do not use any sheets or blankets that are too big for your bed. They should not hang down onto the floor. Have a firm chair that has side arms. You can use this for support while you get dressed. Do not have  throw rugs and other things on the floor that can make you trip. What can I do in the kitchen? Clean up any spills right away. Avoid walking on wet floors. Keep items that you use a lot in easy-to-reach places. If you need to reach something above you, use a strong step stool that has a grab bar. Keep electrical cords out of the way. Do not use floor polish or wax that makes floors slippery. If you must use wax, use non-skid floor wax. Do not have throw rugs and other things on the floor that can make you trip. What can I do with my stairs? Do not leave any items on the stairs. Make sure that there are handrails on both sides of the stairs and use them. Fix handrails that are broken or loose. Make sure that handrails are as long as the stairways. Check any carpeting to make sure that it is firmly attached to the stairs. Fix any carpet that is loose or worn. Avoid having throw rugs at the top or bottom of the stairs. If you do have throw rugs, attach them to the floor with carpet tape. Make sure that you have a light switch at the top of the stairs and the bottom of the stairs. If you do not have them, ask someone to add them for you. What else can I do to help prevent falls? Wear shoes that: Do not have high heels. Have rubber bottoms. Are comfortable and fit you well. Are closed at the toe. Do not wear sandals. If you use a stepladder: Make sure that it is fully opened. Do not climb a closed stepladder. Make sure that both sides of the stepladder are locked into place. Ask someone to hold it for you, if possible. Clearly mark and make sure that you can see: Any grab bars or handrails. First and last steps. Where the edge of each step is. Use tools that help you move around (mobility aids) if they are needed. These include: Canes. Walkers. Scooters. Crutches. Turn on the lights when you go into a dark area. Replace any light bulbs as soon as they burn out. Set up your furniture so  you have a clear path. Avoid moving your furniture around. If any of your floors are uneven, fix them. If there are any pets around you, be aware of where they are. Review your medicines with your doctor. Some medicines can make you feel dizzy. This can increase your chance of falling. Ask your doctor what other things that you can do to help prevent falls. This information is not intended to replace advice given to you by your health care provider. Make sure you discuss any questions you have with your health care provider. Document Released: 05/05/2009 Document Revised: 12/15/2015 Document Reviewed: 08/13/2014 Elsevier Interactive Patient Education  2017 Elsevier  Inc[employer]

## 2024-07-09 NOTE — Progress Notes (Signed)
 Chief Complaint  Patient presents with   Medicare Wellness     Subjective:   Albert Garner is a 83 y.o. male who presents for a Medicare Annual Wellness Visit.  Patient had some trouble hearing questions do to hearing aids   Visit info / Clinical Intake: Medicare Wellness Visit Type:: Subsequent Annual Wellness Visit Persons participating in visit and providing information:: patient Medicare Wellness Visit Mode:: Telephone If telephone:: video declined Since this visit was completed virtually, some vitals may be partially provided or unavailable. Missing vitals are due to the limitations of the virtual format.: Unable to obtain vitals - no equipment If Telephone or Video please confirm:: I connected with patient using audio/video enable telemedicine. I verified patient identity with two identifiers, discussed telehealth limitations, and patient agreed to proceed. Patient Location:: home Provider Location:: home Interpreter Needed?: No Pre-visit prep was completed: no AWV questionnaire completed by patient prior to visit?: no Patient's Overall Health Status Rating: good Typical amount of pain: none Does pain affect daily life?: no Are you currently prescribed opioids?: no  Dietary Habits and Nutritional Risks How many meals a day?: 2 Eats fruit and vegetables daily?: (!) no In the last 2 weeks, have you had any of the following?: none Diabetic:: no  Functional Status Activities of Daily Living (to include ambulation/medication): Independent Ambulation: Independent Medication Administration: Independent Home Management (perform basic housework or laundry): Independent Manage your own finances?: (!) no Concerns about vision?: (!) yes  Fall Screening Falls in the past year?: 0 Number of falls in past year: 0 Was there an injury with Fall?: 0 Fall Risk Category Calculator: 0 Patient Fall Risk Level: Low Fall Risk  Fall Risk Patient at Risk for Falls Due to: No Fall  Risks Fall risk Follow up: Falls evaluation completed; Education provided; Falls prevention discussed; Follow up appointment  Home and Transportation Safety: All rugs have non-skid backing?: N/A, no rugs All stairs or steps have railings?: N/A, no stairs Grab bars in the bathtub or shower?: (!) no Have non-skid surface in bathtub or shower?: (!) no Good home lighting?: yes Regular seat belt use?: yes Hospital stays in the last year:: no  Cognitive Assessment Difficulty concentrating, remembering, or making decisions? : no Will 6CIT or Mini Cog be Completed: no 6CIT or Mini Cog Declined: patient declined  Advance Directives (For Healthcare) Does Patient Have a Medical Advance Directive?: Yes Type of Advance Directive: Healthcare Power of Attorney Copy of Healthcare Power of Attorney in Chart?: No - copy requested  Reviewed/Updated  Reviewed/Updated: Reviewed All (Medical, Surgical, Family, Medications, Allergies, Care Teams, Patient Goals); Surgical History; Family History; Medications; Allergies; Care Teams; Patient Goals; Medical History    Allergies (verified) Patient has no known allergies.   Current Medications (verified) Outpatient Encounter Medications as of 07/09/2024  Medication Sig   aspirin 81 MG tablet Take 81 mg by mouth daily.   atorvastatin  (LIPITOR) 40 MG tablet TAKE ONE TABLET BY MOUTH ONCE DAILY.   Nutritional Supplements (ENSURE ORIGINAL PO) Take 237 mLs by mouth 3 (three) times daily as needed (nutritional support). Drinks 1-3 daily   pantoprazole  (PROTONIX ) 40 MG tablet TAKE 1 TABLET BY MOUTH ONCE DAILY 30 MINUTES BEFORE BREAKFAST   traZODone  (DESYREL ) 50 MG tablet Take 1 tablet (50 mg total) by mouth 2 (two) times daily.   triamcinolone  cream (KENALOG ) 0.1 % Apply 1 application topically 2 (two) times daily.   No facility-administered encounter medications on file as of 07/09/2024.    History: Past Medical  History:  Diagnosis Date   Allergy     occasional seasonal allergies   Carotid artery occlusion    Hypercholesteremia    Hyperlipidemia    Hypertension    Past Surgical History:  Procedure Laterality Date   COLONOSCOPY     2003 w/santagade-no report in epic   COLONOSCOPY  01/2015   Dr. Teressa: four polyps removed, 3 tubular adenomas. next tcs 3 years   COLONOSCOPY N/A 01/31/2018   diverticulosis, internal hemorrhoids.    ESOPHAGOGASTRODUODENOSCOPY N/A 03/09/2017   Dr. Shaaron: Distal esophagus quite macerated, Schatzki ring not dilated, retained food in the hernia sac.   ESOPHAGOGASTRODUODENOSCOPY N/A 04/11/2017   Dr. 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   FOREIGN BODY REMOVAL N/A 03/09/2017   Procedure: FOREIGN BODY REMOVAL;  Surgeon: Shaaron Lamar HERO, MD;  Location: [facility];  Service: Endoscopy;  Laterality: N/A;   INGUINAL HERNIA REPAIR Right 1986   MALONEY DILATION N/A 04/11/2017   Procedure: MALONEY DILATION;  Surgeon: Shaaron Lamar HERO, MD;  Location: [facility];  Service: Endoscopy;  Laterality: N/A;   MALONEY DILATION N/A 11/13/2019   Procedure: AGAPITO DILATION;  Surgeon: Shaaron Lamar HERO, MD;  Location: [facility];  Service: Endoscopy;  Laterality: N/A;   MOUTH SURGERY     with sedation-bottom teeth pulled   Family History  Problem Relation Age of Onset   Heart disease Mother    Throat cancer Brother    Colon cancer Neg Hx    Rectal cancer Neg Hx    Stomach cancer Neg Hx    Social History   Occupational History   Not on file  Tobacco Use   Smoking status: Never    Passive exposure: Never   Smokeless tobacco: Current    Types: Chew  Vaping Use   Vaping status: Never Used  Substance and Sexual Activity   Alcohol use: No   Drug use: No    Sexual activity: Yes   Tobacco Counseling Ready to quit: Not Answered Counseling given: Not Answered  SDOH Screenings   Food Insecurity: No Food Insecurity (07/09/2024)  Housing: Unknown (07/09/2024)  Transportation Needs: No Transportation Needs (07/09/2024)  Utilities: Not At Risk (07/09/2024)  Alcohol Screen: Low Risk (07/01/2023)  Depression (PHQ2-9): Low Risk (07/09/2024)  Financial Resource Strain: Low Risk (07/01/2023)  Physical Activity: Insufficiently Active (07/09/2024)  Social Connections: Socially Integrated (07/09/2024)  Stress: No Stress Concern Present (07/09/2024)  Tobacco Use: High Risk (07/09/2024)  Health Literacy: Adequate Health Literacy (07/09/2024)   See flowsheets for full screening details  Depression Screen PHQ 2 & 9 Depression Scale- Over the past 2 weeks, how often have you been bothered by any of the following problems? Little interest or pleasure in doing things: 0 Feeling down, depressed, or hopeless (PHQ Adolescent also includes...irritable): 0 PHQ-2 Total Score: 0 Trouble falling or staying asleep, or sleeping too much: 2 Feeling tired or having little energy: 0 Poor appetite or overeating (PHQ Adolescent also includes...weight loss): 0 Feeling bad about yourself - or that you are a failure or have let yourself or your family down: 0 Trouble concentrating on things, such as reading the newspaper or watching television (PHQ Adolescent also includes...like school work): 0 Moving or speaking so slowly that  other people could have noticed. Or the opposite - being so fidgety or restless that you have been moving around a lot more than usual: 0 Thoughts that you would be better off dead, or of hurting yourself in some way: 0 PHQ-9 Total Score: 2 If you checked off any problems, how difficult have these problems made it for you to do your work, take care of things at home, or get along with other people?: Not difficult at all     Goals Addressed              This Visit's Progress    Patient Stated       Maintain independence             Objective:    There were no vitals filed for this visit. There is no height or weight on file to calculate BMI.  Hearing/Vision screen Hearing Screening - Comments:: Bilateral hearing aids Vision Screening - Comments:: My eye doctor Immunizations and Health Maintenance Health Maintenance  Topic Date Due   Zoster Vaccines- Shingrix (1 of 2) Never done   Pneumococcal Vaccine: 50+ Years (2 of 2 - PCV20 or PCV21) 10/26/2016   Colonoscopy  01/31/2021   Influenza Vaccine  Never done   COVID-19 Vaccine (3 - 2025-26 season) 03/23/2024   Medicare Annual Wellness (AWV)  07/09/2025   DTaP/Tdap/Td (2 - Td or Tdap) 05/27/2030   Meningococcal B Vaccine  Aged Out        Assessment/Plan:  This is a routine wellness examination for Hussein.  Patient Care Team: Duanne Butler DASEN, MD as PCP - General (Family Medicine) [facility], [facility], Lamar HERO, MD as Consulting Physician (Gastroenterology)  I have personally reviewed and noted the following in the patients chart:   Medical and social history Use of alcohol, tobacco or illicit drugs  Current medications and supplements including opioid prescriptions. Functional ability and status Nutritional status Physical activity Advanced directives List of other physicians Hospitalizations, surgeries, and ER visits in previous 12 months Vitals Screenings to include cognitive, depression, and falls Referrals and appointments  No orders of the defined types were placed in this encounter.  In addition, I have reviewed and discussed with patient certain preventive protocols, quality metrics, and best practice recommendations. A written personalized care plan for preventive services as well as general preventive health recommendations were provided to patient.   Kristiann Noyce, LPN   [id number]   Return in 1 year (on 07/09/2025).  After Visit  Summary: (MyChart) Due to this being a telephonic visit, the after visit summary with patients personalized plan was offered to patient via MyChart   Nurse Notes:

## 2024-07-17 ENCOUNTER — Ambulatory Visit: Payer: Self-pay

## 2024-07-17 NOTE — Telephone Encounter (Signed)
 Attempted to contact patient's daughter Zelda x 1 to discuss symptoms; could not LVM as daughter did not answer after I stated my name during call screening. Will attempt to contact daughter at a later time to further discuss concerns.       Copied from CRM #[id number]. Topic: Clinical - Medical Advice >> Jul 17, 2024  8:39 AM Victoria B wrote: Patient: Patient's daughter alled in ,patient spitting up phlegm but doesn't know what color it is, and says its getting worse

## 2024-07-17 NOTE — Telephone Encounter (Signed)
 FYI Only or Action Required?: FYI only for provider: Home Care.  Patient was last seen in primary care on 08/15/2023 by Duanne Butler DASEN, MD.  Called Nurse Triage reporting Cough.  Symptoms began several days ago.  Interventions attempted: OTC medications: Tylenol, Alka-Seltzer, Honey and Rest, hydration, or home remedies.  Symptoms are: gradually improving.  Triage Disposition: Home Care  Patient/caregiver understands and will follow disposition?: Yes      Reason for Disposition  Cough  Answer Assessment - Initial Assessment Questions Assessment based on daughter's report who checked in with patient earlier today, this Triage RN unable to reach patient via phone call (goes direct to voicemail):    1. ONSET: When did the cough begin?      Day before Christmas Eve, 12/23- started having productive cough  2. SEVERITY: How bad is the cough today?      Has calmed down, not coughing up anymore phlegm today.  3. SPUTUM: Describe the color of your sputum (e.g., none, dry cough; clear, white, yellow, green)     Not observed, unknown.   4. HEMOPTYSIS: Are you coughing up any blood? If Yes, ask: How much? (e.g., flecks, streaks, tablespoons, etc.)     Denies any reports of blood or red color to sputum  5. DIFFICULTY BREATHING: Are you having difficulty breathing? If Yes, ask: How bad is it? (e.g., mild, moderate, severe)      Denies; daughter denies hearing any difficulty or pausing during telephone call and no complaints of difficulty by per patient per daughter  52. FEVER: Do you have a fever? If Yes, ask: What is your temperature, how was it measured, and when did it start?     Chills on 12/23, no more today; took Tylenol that day  7. CARDIAC HISTORY: Do you have any history of heart disease? (e.g., heart attack, congestive heart failure)      Denies  8. LUNG HISTORY: Do you have any history of lung disease?  (e.g., pulmonary embolus, asthma, emphysema)      Denies  9. PE RISK FACTORS: Do you have a history of blood clots? (or: recent major surgery, recent prolonged travel, bedridden)     Denies, no recent travel or family history  10. OTHER SYMPTOMS: Do you have any other symptoms? (e.g., runny nose, wheezing, chest pain)       Not getting sleep recently because of cough/ phlegm  11. TRAVEL: Have you traveled out of the country in the last month? (e.g., travel history, exposures)       Went to outdoor cookout (stew) on 12/23, has not been around anyone ill that aware of.  Also taking Alka-Seltzer Plus day/night and using honey.  Protocols used: Cough - Acute Productive-A-AH

## 2024-08-17 ENCOUNTER — Encounter: Payer: Medicare Other | Admitting: Family Medicine

## 2024-08-24 ENCOUNTER — Other Ambulatory Visit: Payer: Self-pay | Admitting: Family Medicine

## 2024-09-04 ENCOUNTER — Encounter: Admitting: Family Medicine
